# Patient Record
Sex: Female | Born: 1940 | Race: White | Hispanic: No | Marital: Married | Smoking: Never smoker
Health system: Southern US, Community
[De-identification: ages and names within clinical notes are randomized; demographics above are authoritative.]

## PROBLEM LIST (undated history)

## (undated) DIAGNOSIS — E785 Hyperlipidemia, unspecified: Secondary | ICD-10-CM

## (undated) DIAGNOSIS — M722 Plantar fascial fibromatosis: Secondary | ICD-10-CM

## (undated) DIAGNOSIS — D649 Anemia, unspecified: Secondary | ICD-10-CM

## (undated) DIAGNOSIS — M1990 Unspecified osteoarthritis, unspecified site: Secondary | ICD-10-CM

## (undated) DIAGNOSIS — E039 Hypothyroidism, unspecified: Secondary | ICD-10-CM

## (undated) DIAGNOSIS — I10 Essential (primary) hypertension: Secondary | ICD-10-CM

## (undated) DIAGNOSIS — Z780 Asymptomatic menopausal state: Secondary | ICD-10-CM

## (undated) DIAGNOSIS — Z01419 Encounter for gynecological examination (general) (routine) without abnormal findings: Secondary | ICD-10-CM

## (undated) DIAGNOSIS — M8580 Other specified disorders of bone density and structure, unspecified site: Secondary | ICD-10-CM

## (undated) DIAGNOSIS — R350 Frequency of micturition: Secondary | ICD-10-CM

## (undated) DIAGNOSIS — G4700 Insomnia, unspecified: Secondary | ICD-10-CM

## (undated) DIAGNOSIS — R140 Abdominal distension (gaseous): Secondary | ICD-10-CM

## (undated) HISTORY — DX: Unspecified osteoarthritis, unspecified site: M19.90

## (undated) HISTORY — DX: Anemia, unspecified: D64.9

## (undated) HISTORY — DX: Hypothyroidism, unspecified: E03.9

## (undated) HISTORY — DX: Other specified disorders of bone density and structure, unspecified site: M85.80

## (undated) HISTORY — PX: HERNIA REPAIR: SHX51

## (undated) HISTORY — DX: Asymptomatic menopausal state: Z78.0

## (undated) HISTORY — DX: Encounter for gynecological examination (general) (routine) without abnormal findings: Z01.419

## (undated) HISTORY — DX: Hyperlipidemia, unspecified: E78.5

## (undated) HISTORY — DX: Insomnia, unspecified: G47.00

## (undated) HISTORY — DX: Plantar fascial fibromatosis: M72.2

## (undated) HISTORY — DX: Frequency of micturition: R35.0

## (undated) HISTORY — DX: Abdominal distension (gaseous): R14.0

## (undated) HISTORY — DX: Essential (primary) hypertension: I10

## (undated) HISTORY — PX: APPENDECTOMY: SHX54

---

## 1999-03-09 ENCOUNTER — Other Ambulatory Visit: Admission: RE | Admit: 1999-03-09 | Discharge: 1999-03-09 | Payer: Self-pay | Admitting: *Deleted

## 2000-01-20 ENCOUNTER — Other Ambulatory Visit: Admission: RE | Admit: 2000-01-20 | Discharge: 2000-01-20 | Payer: Self-pay | Admitting: Internal Medicine

## 2001-06-05 ENCOUNTER — Other Ambulatory Visit: Admission: RE | Admit: 2001-06-05 | Discharge: 2001-06-05 | Payer: Self-pay | Admitting: Obstetrics and Gynecology

## 2001-07-26 ENCOUNTER — Encounter (INDEPENDENT_AMBULATORY_CARE_PROVIDER_SITE_OTHER): Payer: Self-pay | Admitting: *Deleted

## 2002-08-06 ENCOUNTER — Other Ambulatory Visit: Admission: RE | Admit: 2002-08-06 | Discharge: 2002-08-06 | Payer: Self-pay | Admitting: Obstetrics and Gynecology

## 2003-11-06 ENCOUNTER — Encounter: Admission: RE | Admit: 2003-11-06 | Discharge: 2003-11-06 | Payer: Self-pay | Admitting: Obstetrics and Gynecology

## 2004-05-12 ENCOUNTER — Ambulatory Visit: Payer: Self-pay | Admitting: Family Medicine

## 2004-05-13 ENCOUNTER — Ambulatory Visit: Payer: Self-pay | Admitting: Family Medicine

## 2005-02-09 ENCOUNTER — Encounter: Admission: RE | Admit: 2005-02-09 | Discharge: 2005-02-09 | Payer: Self-pay | Admitting: Obstetrics and Gynecology

## 2005-02-10 ENCOUNTER — Ambulatory Visit: Payer: Self-pay | Admitting: Family Medicine

## 2005-12-06 ENCOUNTER — Ambulatory Visit: Payer: Self-pay | Admitting: Family Medicine

## 2006-05-18 ENCOUNTER — Encounter: Admission: RE | Admit: 2006-05-18 | Discharge: 2006-05-18 | Payer: Self-pay | Admitting: Family Medicine

## 2006-06-02 ENCOUNTER — Encounter: Admission: RE | Admit: 2006-06-02 | Discharge: 2006-06-02 | Payer: Self-pay | Admitting: Family Medicine

## 2006-06-06 ENCOUNTER — Encounter: Admission: RE | Admit: 2006-06-06 | Discharge: 2006-06-06 | Payer: Self-pay | Admitting: Family Medicine

## 2006-08-02 ENCOUNTER — Ambulatory Visit: Payer: Self-pay | Admitting: Family Medicine

## 2006-08-02 LAB — CONVERTED CEMR LAB
Albumin: 3.7 g/dL (ref 3.5–5.2)
Alkaline Phosphatase: 41 units/L (ref 39–117)
Basophils Absolute: 0.1 10*3/uL (ref 0.0–0.1)
Basophils Relative: 1.4 % — ABNORMAL HIGH (ref 0.0–1.0)
Bilirubin, Direct: 0.1 mg/dL (ref 0.0–0.3)
CO2: 28 meq/L (ref 19–32)
Calcium: 9.1 mg/dL (ref 8.4–10.5)
Chloride: 107 meq/L (ref 96–112)
Creatinine, Ser: 0.7 mg/dL (ref 0.4–1.2)
Eosinophils Relative: 4.7 % (ref 0.0–5.0)
GFR calc Af Amer: 108 mL/min
Lymphocytes Relative: 36.2 % (ref 12.0–46.0)
MCHC: 34.2 g/dL (ref 30.0–36.0)
MCV: 88.3 fL (ref 78.0–100.0)
Monocytes Relative: 9.3 % (ref 3.0–11.0)
Platelets: 234 10*3/uL (ref 150–400)
RBC: 4.69 M/uL (ref 3.87–5.11)
RDW: 12.4 % (ref 11.5–14.6)
Sodium: 141 meq/L (ref 135–145)
TSH: 3.01 microintl units/mL (ref 0.35–5.50)
Total Bilirubin: 0.8 mg/dL (ref 0.3–1.2)
Total CHOL/HDL Ratio: 2.8
VLDL: 24 mg/dL (ref 0–40)

## 2006-08-09 ENCOUNTER — Ambulatory Visit: Payer: Self-pay | Admitting: Gastroenterology

## 2006-08-16 ENCOUNTER — Ambulatory Visit: Payer: Self-pay | Admitting: Family Medicine

## 2006-08-17 ENCOUNTER — Encounter: Payer: Self-pay | Admitting: Family Medicine

## 2006-08-17 ENCOUNTER — Ambulatory Visit: Payer: Self-pay | Admitting: Gastroenterology

## 2006-08-17 HISTORY — PX: COLONOSCOPY: SHX174

## 2006-12-22 ENCOUNTER — Encounter: Payer: Self-pay | Admitting: Family Medicine

## 2007-04-05 HISTORY — PX: BREAST BIOPSY: SHX20

## 2007-04-05 LAB — CONVERTED CEMR LAB: Pap Smear: NORMAL

## 2007-06-06 ENCOUNTER — Encounter: Payer: Self-pay | Admitting: Family Medicine

## 2007-06-06 ENCOUNTER — Encounter (INDEPENDENT_AMBULATORY_CARE_PROVIDER_SITE_OTHER): Payer: Self-pay | Admitting: Diagnostic Radiology

## 2007-06-06 ENCOUNTER — Encounter: Admission: RE | Admit: 2007-06-06 | Discharge: 2007-06-06 | Payer: Self-pay | Admitting: Family Medicine

## 2007-12-05 ENCOUNTER — Ambulatory Visit: Payer: Self-pay | Admitting: Family Medicine

## 2007-12-05 DIAGNOSIS — M899 Disorder of bone, unspecified: Secondary | ICD-10-CM

## 2007-12-05 DIAGNOSIS — D649 Anemia, unspecified: Secondary | ICD-10-CM

## 2007-12-05 DIAGNOSIS — T50995A Adverse effect of other drugs, medicaments and biological substances, initial encounter: Secondary | ICD-10-CM

## 2007-12-05 DIAGNOSIS — E785 Hyperlipidemia, unspecified: Secondary | ICD-10-CM

## 2007-12-05 DIAGNOSIS — G4700 Insomnia, unspecified: Secondary | ICD-10-CM | POA: Insufficient documentation

## 2007-12-05 DIAGNOSIS — M949 Disorder of cartilage, unspecified: Secondary | ICD-10-CM

## 2007-12-05 DIAGNOSIS — E039 Hypothyroidism, unspecified: Secondary | ICD-10-CM

## 2007-12-05 LAB — CONVERTED CEMR LAB
Nitrite: NEGATIVE
Specific Gravity, Urine: 1.02

## 2007-12-07 LAB — CONVERTED CEMR LAB
ALT: 15 units/L (ref 0–35)
Albumin: 3.8 g/dL (ref 3.5–5.2)
Basophils Relative: 0.7 % (ref 0.0–3.0)
Calcium: 8.8 mg/dL (ref 8.4–10.5)
Eosinophils Relative: 4.4 % (ref 0.0–5.0)
GFR calc non Af Amer: 76 mL/min
HCT: 39.2 % (ref 36.0–46.0)
HDL: 65 mg/dL (ref >39.0)
Hemoglobin: 13.7 g/dL (ref 12.0–15.0)
MCV: 88.5 fL (ref 78.0–100.0)
Neutro Abs: 3.5 10*3/uL (ref 1.4–7.7)
Neutrophils Relative %: 60.1 % (ref 43.0–77.0)
Platelets: 202 10*3/uL (ref 150–400)
Potassium: 3.7 meq/L (ref 3.5–5.1)
Sodium: 142 meq/L (ref 135–145)
Total Bilirubin: 0.9 mg/dL (ref 0.3–1.2)
Total CHOL/HDL Ratio: 3.1
Total Protein: 6.7 g/dL (ref 6.0–8.3)
Triglycerides: 85 mg/dL (ref 0–149)
Vit D, 1,25-Dihydroxy: 28 — ABNORMAL LOW (ref 30–89)
WBC: 5.9 10*3/uL (ref 4.5–10.5)

## 2007-12-14 ENCOUNTER — Encounter: Payer: Self-pay | Admitting: Family Medicine

## 2007-12-25 ENCOUNTER — Ambulatory Visit: Payer: Self-pay | Admitting: Family Medicine

## 2007-12-27 ENCOUNTER — Telehealth: Payer: Self-pay | Admitting: Family Medicine

## 2008-03-20 ENCOUNTER — Ambulatory Visit: Payer: Self-pay | Admitting: Family Medicine

## 2008-04-07 LAB — CONVERTED CEMR LAB: Pap Smear: NORMAL

## 2008-06-19 ENCOUNTER — Encounter: Admission: RE | Admit: 2008-06-19 | Discharge: 2008-06-19 | Payer: Self-pay | Admitting: Family Medicine

## 2008-07-02 ENCOUNTER — Encounter: Payer: Self-pay | Admitting: Family Medicine

## 2008-08-05 ENCOUNTER — Telehealth: Payer: Self-pay | Admitting: Family Medicine

## 2009-02-05 ENCOUNTER — Ambulatory Visit: Payer: Self-pay | Admitting: Family Medicine

## 2009-02-05 DIAGNOSIS — I10 Essential (primary) hypertension: Secondary | ICD-10-CM

## 2009-02-05 LAB — CONVERTED CEMR LAB
Bilirubin Urine: NEGATIVE
Nitrite: NEGATIVE
Protein, U semiquant: NEGATIVE
Specific Gravity, Urine: 1.02
Urobilinogen, UA: 0.2
WBC Urine, dipstick: NEGATIVE
pH: 6.5

## 2009-02-06 LAB — CONVERTED CEMR LAB
AST: 20 units/L (ref 0–37)
Alkaline Phosphatase: 39 units/L (ref 39–117)
Basophils Absolute: 0 10*3/uL (ref 0.0–0.1)
Basophils Relative: 0.6 % (ref 0.0–3.0)
Cholesterol: 203 mg/dL — ABNORMAL HIGH (ref 0–200)
Eosinophils Absolute: 0.3 10*3/uL (ref 0.0–0.7)
Eosinophils Relative: 4.2 % (ref 0.0–5.0)
GFR calc non Af Amer: 66.25 mL/min (ref >60)
Hemoglobin: 13.8 g/dL (ref 12.0–15.0)
Lymphocytes Relative: 25.9 % (ref 12.0–46.0)
Lymphs Abs: 1.6 10*3/uL (ref 0.7–4.0)
MCHC: 35.2 g/dL (ref 30.0–36.0)
MCV: 91.8 fL (ref 78.0–100.0)
Monocytes Absolute: 0.6 10*3/uL (ref 0.1–1.0)
Neutro Abs: 3.8 10*3/uL (ref 1.4–7.7)
Platelets: 182 10*3/uL (ref 150.0–400.0)
RBC: 4.27 M/uL (ref 3.87–5.11)
RDW: 12.9 % (ref 11.5–14.6)
VLDL: 20 mg/dL (ref 0.0–40.0)

## 2009-04-07 ENCOUNTER — Telehealth: Payer: Self-pay | Admitting: Family Medicine

## 2009-06-23 ENCOUNTER — Encounter: Admission: RE | Admit: 2009-06-23 | Discharge: 2009-06-23 | Payer: Self-pay | Admitting: Obstetrics and Gynecology

## 2009-10-20 ENCOUNTER — Telehealth: Payer: Self-pay | Admitting: Family Medicine

## 2009-12-17 ENCOUNTER — Ambulatory Visit: Payer: Self-pay | Admitting: Internal Medicine

## 2009-12-19 LAB — CONVERTED CEMR LAB
ALT: 11 units/L (ref 0–35)
AST: 17 units/L (ref 0–37)
Albumin: 3.2 g/dL — ABNORMAL LOW (ref 3.5–5.2)
Alkaline Phosphatase: 60 units/L (ref 39–117)
Basophils Relative: 0.2 % (ref 0.0–3.0)
Creatinine, Ser: 0.8 mg/dL (ref 0.4–1.2)
HCT: 35.6 % — ABNORMAL LOW (ref 36.0–46.0)
MCHC: 34 g/dL (ref 30.0–36.0)
MCV: 91.3 fL (ref 78.0–100.0)
RBC: 3.9 M/uL (ref 3.87–5.11)
RDW: 14.9 % — ABNORMAL HIGH (ref 11.5–14.6)
Sodium: 137 meq/L (ref 135–145)
Total Bilirubin: 0.5 mg/dL (ref 0.3–1.2)
Total Protein: 6.3 g/dL (ref 6.0–8.3)
WBC: 13.6 10*3/uL — ABNORMAL HIGH (ref 4.5–10.5)

## 2010-04-21 ENCOUNTER — Ambulatory Visit
Admission: RE | Admit: 2010-04-21 | Discharge: 2010-04-21 | Payer: Self-pay | Source: Home / Self Care | Attending: Family Medicine | Admitting: Family Medicine

## 2010-04-21 ENCOUNTER — Encounter: Payer: Self-pay | Admitting: Family Medicine

## 2010-04-21 ENCOUNTER — Telehealth: Payer: Self-pay | Admitting: Family Medicine

## 2010-04-21 ENCOUNTER — Other Ambulatory Visit: Payer: Self-pay | Admitting: Family Medicine

## 2010-04-21 LAB — HEPATIC FUNCTION PANEL
ALT: 13 U/L (ref 0–35)
AST: 17 U/L (ref 0–37)
Albumin: 3.7 g/dL (ref 3.5–5.2)
Alkaline Phosphatase: 46 U/L (ref 39–117)
Bilirubin, Direct: 0.1 mg/dL (ref 0.0–0.3)
Total Bilirubin: 0.5 mg/dL (ref 0.3–1.2)
Total Protein: 6.4 g/dL (ref 6.0–8.3)

## 2010-04-21 LAB — CONVERTED CEMR LAB
Nitrite: NEGATIVE
Protein, U semiquant: NEGATIVE
Specific Gravity, Urine: 1.015
Urobilinogen, UA: 0.2
WBC Urine, dipstick: NEGATIVE
pH: 6.5

## 2010-04-21 LAB — CBC WITH DIFFERENTIAL/PLATELET
Basophils Absolute: 0 10*3/uL (ref 0.0–0.1)
Basophils Relative: 0.6 % (ref 0.0–3.0)
Eosinophils Absolute: 0.3 10*3/uL (ref 0.0–0.7)
Eosinophils Relative: 5.5 % — ABNORMAL HIGH (ref 0.0–5.0)
HCT: 38.6 % (ref 36.0–46.0)
Hemoglobin: 13.3 g/dL (ref 12.0–15.0)
Lymphocytes Relative: 25.9 % (ref 12.0–46.0)
Lymphs Abs: 1.5 10*3/uL (ref 0.7–4.0)
MCHC: 34.4 g/dL (ref 30.0–36.0)
MCV: 88.1 fl (ref 78.0–100.0)
Monocytes Absolute: 0.6 10*3/uL (ref 0.1–1.0)
Monocytes Relative: 10.3 % (ref 3.0–12.0)
Neutro Abs: 3.4 10*3/uL (ref 1.4–7.7)
Neutrophils Relative %: 57.7 % (ref 43.0–77.0)
Platelets: 237 10*3/uL (ref 150.0–400.0)
RBC: 4.38 Mil/uL (ref 3.87–5.11)
RDW: 15.6 % — ABNORMAL HIGH (ref 11.5–14.6)
WBC: 5.9 10*3/uL (ref 4.5–10.5)

## 2010-04-21 LAB — BASIC METABOLIC PANEL
BUN: 16 mg/dL (ref 6–23)
CO2: 27 mEq/L (ref 19–32)
Calcium: 9.3 mg/dL (ref 8.4–10.5)
Chloride: 108 mEq/L (ref 96–112)
Creatinine, Ser: 0.7 mg/dL (ref 0.4–1.2)
GFR: 89.7 mL/min (ref >60.00)
Glucose, Bld: 88 mg/dL (ref 70–99)
Potassium: 5.2 mEq/L — ABNORMAL HIGH (ref 3.5–5.1)
Sodium: 141 mEq/L (ref 135–145)

## 2010-04-21 LAB — LIPID PANEL
Cholesterol: 207 mg/dL — ABNORMAL HIGH (ref 0–200)
HDL: 81.2 mg/dL (ref >39.00)
Total CHOL/HDL Ratio: 3
Triglycerides: 116 mg/dL (ref 0.0–149.0)
VLDL: 23.2 mg/dL (ref 0.0–40.0)

## 2010-04-21 LAB — TSH: TSH: 1.86 u[IU]/mL (ref 0.35–5.50)

## 2010-04-21 LAB — FOLLICLE STIMULATING HORMONE: FSH: 32.7 m[IU]/mL

## 2010-04-21 LAB — LDL CHOLESTEROL, DIRECT: Direct LDL: 110.7 mg/dL

## 2010-04-25 ENCOUNTER — Encounter: Payer: Self-pay | Admitting: Family Medicine

## 2010-05-06 NOTE — Procedures (Signed)
Summary: COLON (Dr Blossom Hoops)   Colonoscopy  Procedure date:  07/26/2001  Findings:      Location:  [facility].    Procedures Next Due Date:    Colonoscopy: 08/2006 Patient Name: Katherine Holland, Katherine Holland MRN:  Procedure Procedures: Colonoscopy CPT: 13086.    with biopsy. CPT: Q5068410.  Colorectal cancer screening, average risk CPT: G0121.  Personnel: Endoscopist: Ulyess Mort, MD.  Referred By: Rogene Houston, MD.  Exam Location: Exam performed in Outpatient Clinic. Outpatient  Patient Consent: Procedure, Alternatives, Risks and Benefits discussed, consent obtained, from patient. Consent was obtained by the RN.  Indications Symptoms: Abdominal pain / bloating.  Average Risk Screening Routine.  History  Pre-Exam Physical: Performed Jul 26, 2001. Cardio-pulmonary exam, Rectal exam, Abdominal exam, Extremity exam, Mental status exam WNL.  Exam Exam: Extent of exam reached: Cecum, extent intended: Cecum.  The cecum was identified by appendiceal orifice and IC valve. Colon retroflexion performed. Images were not taken. ASA Classification: II. Tolerance: fair, adequate exam.  Monitoring: Pulse and BP monitoring, Oximetry used. Supplemental O2 given.  Colon Prep Prep results: good.  Sedation Meds: Patient assessed and found to be appropriate for moderate (conscious) sedation. Fentanyl 125 mcg. given IV. Versed 12 given IV.  Findings - DIVERTICULOSIS: Descending Colon to Sigmoid Colon. Comments: mild diverticulosis.  - NOT SEEN ON EXAM: Cecum to Rectum. Polyps, AVM's, Tumors, Melanosis, Crohn's, Hemorrhoids, Comments: this is a very tortuous and redundant colon. slight erythema of mucosa R side bx,s =3  ??? mild underlying colitis.   Assessment Abnormal examination, see findings above.  Events  Unplanned Interventions: No intervention was required.  Unplanned Events: There were no complications. Plans Medication Plan: Continue current  medications.  Patient Education: Patient given standard instructions for: Diverticulosis. Yearly hemoccult testing recommended. Patient instructed to get routine colonoscopy every 5 years.  Disposition: After procedure patient sent to recovery. After recovery patient sent home.    This report was created from the original endoscopy report, which was reviewed and signed by the above listed endoscopist.

## 2010-05-06 NOTE — Progress Notes (Signed)
Summary: refiils  Phone Note Refill Request Message from:  Fax from Pharmacy on April 21, 2010 4:44 PM  Refills Requested: Medication #1:  PROMETRIUM 100 MG CAPS once daily  Medication #2:  RAMIPRIL 2.5 MG CAPS 1 by mouth once daily Initial call taken by: Kern Reap CMA Duncan Dull),  April 21, 2010 4:44 PM    Prescriptions: RAMIPRIL 2.5 MG CAPS (RAMIPRIL) 1 by mouth once daily  #30 x 11   Entered by:   Kern Reap CMA (AAMA)   Authorized by:   Judithann Sheen MD   Signed by:   Kern Reap CMA (AAMA) on 04/21/2010   Method used:   Electronically to        [facility] (retail)       [address]       [place], [place]  [address]       Ph: [phone] or [phone]       Fax: [phone]   RxID:   [id number] PROMETRIUM 100 MG CAPS (PROGESTERONE MICRONIZED) once daily  #30 x 11   Entered by:   Kern Reap CMA (AAMA)   Authorized by:   Judithann Sheen MD   Signed by:   Kern Reap CMA (AAMA) on 04/21/2010   Method used:   Electronically to        [facility] (retail)       [address]       [place], [place]  [address]       Ph: [phone] or [phone]       Fax: [phone]   RxID:   [id number]

## 2010-05-06 NOTE — Progress Notes (Signed)
Summary: refill ambien with 5 refills   Phone Note From Pharmacy   Caller: [facility]  Reason for Call: Needs renewal Summary of Call: refill gen Remus Loffler  Initial call taken by: Pura Spice, RN,  April 07, 2009 10:46 AM  Follow-up for Phone Call        ok per dr Scotty Court with 5 refills  Follow-up by: Pura Spice, RN,  April 07, 2009 10:46 AM    Prescriptions: AMBIEN 10 MG TABS (ZOLPIDEM TARTRATE) 1 HS FOR SLEEP  #30 x 5   Entered by:   Pura Spice, RN   Authorized by:   Judithann Sheen MD   Signed by:   Pura Spice, RN on 04/07/2009   Method used:   Telephoned to ...       [facility] (retail)       [address]       [place], [place]  [address]       Ph: [phone] or [phone]       Fax: [phone]   RxID:   [id number]

## 2010-05-06 NOTE — Assessment & Plan Note (Signed)
Summary: pt will come in fasting/njr   Vital Signs:  Patient profile:   70 year old female Height:      63.25 inches Weight:      143 pounds Pulse rate:   80 / minute Pulse rhythm:   regular BP sitting:   120 / 82  (left arm)  Vitals Entered By: Kyung Rudd, CMA (April 21, 2010 11:01 AM)  History of Present Illness: This 70 year old white married female is in for discussion of her medical problems as listed per year it she has her Pap smear was Dr. Floyde Parkins, gynecologist Patient relates he feels good with no major complaint. She continues to need Ambien for chronic insomnia. She as nasal allergies which is [place] a somewhat with Zyrtec 10 mg q. day Hypertension has been well controlled with Remeron for a triple 5 mg q.d. for many years Patient has had flu injection as well as up-to-date on pneumonia  Current Medications (verified): 1)  Ramipril 2.5 Mg Caps (Ramipril) .Marland Kitchen.. 1 By Mouth Once Daily 2)  Prometrium 100 Mg Caps (Progesterone Micronized) .... Once Daily 3)  Ambien 10 Mg Tabs (Zolpidem Tartrate) .Marland Kitchen.. 1 Hs For Sleep 4)  Adult Aspirin Ec Low Strength 81 Mg Tbec (Aspirin) .Marland Kitchen.. 1 Qd 5)  Premarin 0.45 Mg Tabs (Estrogens Conjugated) .... One Tablet By Mouth Once Daily 6)  Calcium 500 Mg Tabs (Calcium) .... One Tablet By Mouth Once Daily 7)  Vitamin E 600 Unit  Caps (Vitamin E) .... 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  Alcohol Use - yes: 1 daily  Daily Caffeine Use: 1 daily  Illicit Drug Use - no  Risk Factors: Smoking Status: never (12/17/2009)  Social History: Reviewed history from 12/17/2009 and no changes required. Retired Married Childern Patient has never smoked.  Alcohol Use - yes: 1 daily  Daily Caffeine Use: 1 daily  Illicit Drug Use - no  Review of Systems      See HPI General:  See HPI. Eyes:  Denies blurring, discharge, double vision, eye irritation, eye pain, halos, itching, light sensitivity, red eye, vision loss-1 eye, and vision loss-both eyes. ENT:  Complains of nasal congestion. CV:  Denies bluish discoloration of lips or nails, chest pain or discomfort, difficulty breathing at night, difficulty breathing while lying down, fainting, fatigue, leg cramps with exertion, lightheadness, near fainting, palpitations, shortness of breath with exertion, swelling of feet, swelling of hands, and weight gain; blood pressure well controlled. Resp:  Denies chest discomfort, chest pain with inspiration, cough, coughing up blood, excessive snoring, hypersomnolence, morning headaches, pleuritic, shortness of breath, sputum productive, and wheezing. GI:  Denies abdominal pain, bloody stools, change in bowel habits, constipation, dark tarry stools, diarrhea, excessive appetite, gas, hemorrhoids, indigestion, loss of appetite, nausea, vomiting, vomiting blood, and yellowish skin color. GU:  Denies abnormal vaginal bleeding, decreased libido, discharge, dysuria, genital sores, hematuria, incontinence, nocturia, urinary frequency, and urinary hesitancy. MS:  Denies joint pain, joint redness, joint swelling, loss of strength, low back pain, mid back pain,  muscle aches, muscle , cramps, muscle weakness, stiffness, and  thoracic pain. Derm:  Denies changes in color of skin, changes in nail beds, dryness, excessive perspiration, flushing, hair loss, insect bite(s), itching, lesion(s), poor wound healing, and rash. Neuro:  Denies brief paralysis, difficulty with concentration, disturbances in coordination, falling down, headaches, inability to speak, memory loss, numbness, poor balance, seizures, sensation of room spinning, tingling, tremors, visual disturbances, and weakness. Psych:  Denies alternate hallucination ( auditory/visual), anxiety, depression, easily angered, easily tearful, irritability, mental problems, panic attacks, sense of great danger, suicidal thoughts/plans, thoughts of violence, unusual visions or sounds, and thoughts /plans of harming others.  Physical Exam  General:  Well-developed,well-nourished,in no acute distress; alert,appropriate and cooperative throughout examination Head:  Normocephalic and atraumatic without obvious abnormalities. No apparent alopecia or balding. Eyes:  No corneal or conjunctival inflammation noted. EOMI. Perrla. Funduscopic exam benign, without hemorrhages, exudates or papilledema. Vision grossly normal. Ears:  External ear exam shows no significant lesions or deformities.  Otoscopic examination reveals clear canals, tympanic membranes are intact bilaterally without bulging, retraction, inflammation or discharge. Hearing is grossly normal bilaterally. Nose:  nasal mucosa slightly swollen pale minimal bogginess clear drainage Mouth:  Oral mucosa and oropharynx without lesions or exudates.  Teeth in good repair. Neck:  No deformities, masses, or tenderness noted. Chest Wall:  No deformities, masses, or tenderness noted. Breasts:  examined by Dr. Rosalio Macadamia Lungs:  Normal respiratory effort, chest expands symmetrically. Lungs are clear to auscultation, no crackles or wheezes. Heart:  Normal rate and regular rhythm. S1 and S2 normal without gallop, murmur, click, rub or  other extra sounds. Abdomen:  Bowel sounds positive,abdomen soft and non-tender without masses, organomegaly or hernias noted. Rectal:  GYN Msk:  No deformity or scoliosis noted of thoracic or lumbar spine.   Pulses:  R and L carotid,radial,femoral,dorsalis pedis and posterior tibial pulses are full and equal bilaterally Extremities:  No clubbing, cyanosis, edema, or deformity noted with normal full range of motion of all joints.   Neurologic:  No cranial nerve deficits noted. Station and gait are normal. Plantar reflexes are down-going bilaterally. DTRs are symmetrical throughout. Sensory, motor and coordinative functions appear intact.   Impression & Recommendations:  Problem # 1:  ESSENTIAL HYPERTENSION, BENIGN (ICD-401.1) Assessment Improved  Her updated medication list for this problem includes:    Ramipril 2.5 Mg Caps (Ramipril) .Marland Kitchen... 1 by mouth once daily  Orders: Specimen Handling ([address]) TLB-BMP (Basic Metabolic Panel-BMET) (80048-METABOL)  Problem # 2:  INSOMNIA, CHRONIC (ICD-307.42) Assessment: Unchanged Ambien 10 mg h.s.  Problem # 3:  POSTMENOPAUSAL ON HORMONE REPLACEMENT THERAPY (ICD-V07.4)  Orders: Specimen Handling (40981) TLB-CBC Platelet - w/Differential (85025-CBCD) TLB-FSH (Follicle Stimulating Hormone) (83001-FSH)  Problem # 4:  OSTEOPENIA (ICD-733.90) Assessment: Unchanged  The following medications were removed from the medication list:    Calcium 500 Mg Tabs (Calcium) ..... One tablet by mouth once daily  Complete Medication List: 1)  Ramipril 2.5 Mg Caps (Ramipril) .Marland Kitchen.. 1 by mouth once daily 2)  Prometrium 100 Mg Caps (Progesterone micronized) .... Once daily 3)  Ambien 10 Mg Tabs (Zolpidem tartrate) .Marland Kitchen.. 1 hs for sleep 4)  Adult Aspirin Ec Low Strength 81 Mg Tbec (Aspirin) .Marland Kitchen.. 1 qd 5)  Vitamin E 600 Unit Caps (Vitamin e) .... One capsule by mouth once daily 6)  Os Cal Vit D  7)  Premarin 0.3 Mg Tabs (Estrogens conjugated) .Marland Kitchen.. 1 qd  Other  Orders: Venipuncture (19147) T-Vitamin D (25-Hydroxy) ([address]2956-21308) UA Dipstick w/o Micro (automated)  (  81003) TLB-Lipid Panel (80061-LIPID) TLB-Hepatic/Liver Function Pnl (80076-HEPATIC) TLB-TSH (Thyroid Stimulating Hormone) (84443-TSH)  Patient Instructions: 1)  continue to exercise as you're doing 2)  Continue her regular medications Prescriptions: AMBIEN 10 MG TABS (ZOLPIDEM TARTRATE) 1 HS FOR SLEEP  #30 x 11   Entered and Authorized by:   Judithann Sheen MD   Signed by:   Judithann Sheen MD on 04/21/2010   Method used:   Print then Give to Patient   RxID:   [id number] PROMETRIUM 100 MG CAPS (PROGESTERONE MICRONIZED) once daily  #30 x 11   Entered and Authorized by:   Judithann Sheen MD   Signed by:   Judithann Sheen MD on 04/21/2010   Method used:   Electronically to        [facility] ([facility]ail)       [address]       [place], [place]  [address]       Ph: [phone] or [phone]       Fax: [phone]   RxID:   [id number] RAMIPRIL 2.5 MG CAPS (RAMIPRIL) 1 by mouth once daily  #30 x 11   Entered and Authorized by:   Judithann Sheen MD   Signed by:   Judithann Sheen MD on 04/21/2010   Method used:   Electronically to        [facility] (retail)       [address]       [place], [place]  [address]       Ph: [phone] or [phone]       Fax: [phone]   RxID:   [id number] PREMARIN 0.3 MG TABS (ESTROGENS CONJUGATED) 1 qd  #30 x 11   Entered and Authorized by:   Judithann Sheen MD   Signed by:   Judithann Sheen MD on 04/21/2010   Method used:   Electronically to        [facility] (retail)       [address]       [place], [place]  [address]       Ph: [phone] or [phone]       Fax: [phone]   RxID:   [id number] AMBIEN 10 MG TABS (ZOLPIDEM TARTRATE) 1 HS FOR SLEEP  #30 x 5   Entered and Authorized by:   Judithann Sheen MD   Signed by:   Judithann Sheen MD on  04/21/2010   Method used:   Print then Give to Patient   RxID:   [id number] PROMETRIUM 100 MG CAPS (PROGESTERONE MICRONIZED) once daily  #30 x 11   Entered and Authorized by:   Judithann Sheen MD   Signed by:   Judithann Sheen MD on 04/21/2010   Method used:   Electronically to        [facility]* (retail)       [address]       [place], [place]  [address]       Ph: [phone]       Fax: [phone]   RxID:   [id number] RAMIPRIL 2.5 MG CAPS (RAMIPRIL) 1 by mouth once daily  #30 x 11   Entered and Authorized by:   Judithann Sheen MD   Signed by:   Judithann Sheen MD on 04/21/2010   Method used:   Electronically to  [facility]* (retail)       [address]       [place], [place]  [address]       Ph: [phone]       Fax: [phone]   RxID:   [id number]    Orders Added: 1)  Venipuncture [84132] 2)  T-Vitamin D (25-Hydroxy) [phone] 3)  UA Dipstick w/o Micro (automated)  [81003] 4)  Specimen Handling [99000] 5)  TLB-Lipid Panel [80061-LIPID] 6)  TLB-BMP (Basic Metabolic Panel-BMET) [80048-METABOL] 7)  TLB-CBC Platelet - w/Differential [85025-CBCD] 8)  TLB-Hepatic/Liver Function Pnl [80076-HEPATIC] 9)  TLB-TSH (Thyroid Stimulating Hormone) [84443-TSH] 10)  TLB-FSH (Follicle Stimulating Hormone) [83001-FSH] 11)  Est. Patient Level IV [66440]    Laboratory Results   Urine Tests  Date/Time Recieved: April 21, 2010 3:03 PM  Date/Time Reported: April 21, 2010 3:03 PM   Routine Urinalysis   Color: yellow Appearance: Clear Glucose: negative   (Normal Range: Negative) Bilirubin: negative   (Normal Range: Negative) Ketone: negative   (Normal Range: Negative) Spec. Gravity: 1.015   (Normal Range: 1.003-1.035) Blood: negative   (Normal Range: Negative) pH: 6.5   (Normal Range: 5.0-8.0) Protein: negative   (Normal Range: Negative) Urobilinogen: 0.2   (Normal Range: 0-1) Nitrite: negative    (Normal Range: Negative) Leukocyte Esterace: negative   (Normal Range: Negative)    Comments: Wynona Canes, CMA  April 21, 2010 3:03 PM

## 2010-05-06 NOTE — Progress Notes (Signed)
Summary: rx zolpidem 10 mg w/ 5 refills   Phone Note From Pharmacy   [facility]  Reason for Call: Needs renewal Summary of Call: refill zolpidem 10 mg  Initial call taken by: Pura Spice, RN,  October 20, 2009 3:16 PM  Follow-up for Phone Call        ok per dr Scotty Court x 5  Follow-up by: Pura Spice, RN,  October 20, 2009 3:17 PM    New/Updated Medications: AMBIEN 10 MG TABS (ZOLPIDEM TARTRATE) 1 HS FOR SLEEP Prescriptions: AMBIEN 10 MG TABS (ZOLPIDEM TARTRATE) 1 HS FOR SLEEP  #30 x 5   Entered by:   Pura Spice, RN   Authorized by:   Judithann Sheen MD   Signed by:   Pura Spice, RN on 10/20/2009   Method used:   Printed then faxed to ...       [facility] ([facility]ail)       [address]       [place], [place]  [address]       Ph: [phone] or [phone]       Fax: [phone]   RxID:   [id number]

## 2010-05-06 NOTE — Procedures (Signed)
Summary: COLON  Colonoscopy Report/[facility]   Imported By: Maryln Gottron 09/16/2009 10:48:59  _____________________________________________________________________  External Attachment:    Type:   Image     Comment:   External Document

## 2010-05-06 NOTE — Assessment & Plan Note (Signed)
Summary: change in bowels, constipation/dn    History of Present Illness Visit Type: consult  Primary GI MD: Lina Sar MD Primary Provider: Valarie Merino, MD  Requesting Provider: Valarie Merino, MD Chief Complaint: Lower abd pain, constipation, and bloating  History of Present Illness:   This is a 70 y.o. white female with a rather abrupt onset of constipation. Until 3 weeks ago, she was able to have a bowel movement every day. She never used laxatives. She had a normal colonoscopy except for diverticulosis in May 2008. She had a small rectocele and a few scattered diverticula. She also had a very tortuous colon with fixed segments secondary to adhesions according to Dr Jackey Loge' report. She denies any rectal bleeding. She had a regular gynecological examination by Dr Rosalio Macadamia. The patient's husband had recent surgery and patient has been attending to him and consequently her eating habits have been irregular. Also, her level of activity has changed.   GI Review of Systems    Reports abdominal pain and  bloating.     Location of  Abdominal pain: lower abdomen.    Denies acid reflux, belching, chest pain, dysphagia with liquids, dysphagia with solids, heartburn, loss of appetite, nausea, vomiting, vomiting blood, weight loss, and  weight gain.      Reports constipation.     Denies anal fissure, black tarry stools, change in bowel habit, diarrhea, diverticulosis, fecal incontinence, heme positive stool, hemorrhoids, irritable bowel syndrome, jaundice, light color stool, liver problems, rectal bleeding, and  rectal pain.    Current Medications (verified): 1)  Altace 2.5 Mg Tabs (Ramipril) .... Once Daily 2)  Prometrium 100 Mg Caps (Progesterone Micronized) .... Once Daily 3)  Ambien 10 Mg Tabs (Zolpidem Tartrate) .Marland Kitchen.. 1 Hs For Sleep 4)  Adult Aspirin Ec Low Strength 81 Mg Tbec (Aspirin) .Marland Kitchen.. 1 Qd 5)  Premarin 0.45 Mg Tabs (Estrogens Conjugated) .... One Tablet By Mouth Once  Daily 6)  Calcium 500 Mg Tabs (Calcium) .... One Tablet By Mouth Once Daily 7)  Vitamin E 600 Unit  Caps (Vitamin E) .... 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  Alcohol Use - yes: 1 daily  Daily Caffeine Use: 1 daily  Illicit Drug Use - no Smoking Status:  never Drug Use:  no  Review of Systems       The patient complains of allergy/sinus and sleeping problems.  The patient denies anemia, anxiety-new, arthritis/joint pain, back pain, blood in urine, breast changes/lumps, change in vision, confusion, cough, coughing up blood, depression-new, fainting, fatigue, fever, headaches-new, hearing problems, heart murmur, heart rhythm changes, itching, menstrual pain, muscle pains/cramps, night sweats, nosebleeds, pregnancy symptoms, shortness of breath, skin rash, sore throat, swelling of feet/legs, swollen lymph glands, thirst - excessive , urination - excessive , urination changes/pain, urine leakage, vision changes, and voice change.         Pertinent positive and negative review of systems were noted in the above HPI. All other ROS was otherwise negative.   Vital Signs:  Patient profile:  70 year old female Height:      63.5 inches Weight:      145 pounds BMI:     25.37 BSA:     1.70 Pulse rate:    88 / minute Pulse rhythm:   regular BP sitting:   132 / 68  (left arm) Cuff size:   regular  Vitals Entered By: Ok Anis CMA (December 17, 2009 1:14 PM)  Physical Exam  General:  Well developed, well nourished, no acute distress. Eyes:  PERRLA, no icterus. Mouth:  No deformity or lesions, dentition normal. Neck:  Supple; no masses or thyromegaly. Lungs:  Clear throughout to auscultation. Heart:  Regular rate and rhythm; no murmurs, rubs,  or bruits. Abdomen:  protuberant abdomen with hyperactive bowel sounds and rushes. Mild tenderness, diffusely more so across the upper abdomen. Increased tympany. No palpable mass. No ascites. Liver edge at costal margin. Rectal:  normal perianal area with normal sphincter tone. In the rectal ampulla, there is no prolapse, no stool. Tiny amount of stool was Hemoccult negative. Extremities:  No clubbing, cyanosis, edema or deformities noted. Skin:  Intact without significant lesions or rashes. Psych:  Alert and cooperative. Normal mood and affect.   Impression & Recommendations:  Problem # 1:  CONSTIPATION (ICD-564.00) Patient has had sudden onset constipation which may be related to a change in her eating habits and activity in the last few weeks but it is suspicious for an intestinal obstruction. She has numerous laxatives in the last 7 days. We will obtain a KUB to rule out small or large bowel obstruction and then decide if we should go forward with a CT scan of the abdomen. We could push laxatives specifically magnesium citrate and MiraLax. We will obtain a CBC, metabolic panel and TSH today. Her last colonoscopy was in 2008. We may also need to consider pelvic ultrasound versus CT scan of the abdomen. Orders: T-Abdomen 2-view (74020TC) TLB-CMP (Comprehensive Metabolic Pnl) (80053-COMP) TLB-CBC Platelet - w/Differential (85025-CBCD) TLB-TSH (Thyroid Stimulating Hormone) (84443-TSH)  Problem # 2:  HYPOTHYROIDISM (ICD-244.9) We will be  checking a TSH today.  Problem # 3:  CONSTIPATION (ICD-564.00) Orders: T-Abdomen 2-view (74020TC) TLB-CMP (Comprehensive Metabolic Pnl) (80053-COMP) TLB-CBC Platelet - w/Differential (85025-CBCD) TLB-TSH (Thyroid Stimulating Hormone) (84443-TSH)  Patient Instructions: 1)  flat and upright x-ray of the abdomen to rule out obstruction. 2)  Depending on the results, we will schedule a CT scan of the abdomen and pelvis. 3)  CBC, metabolic panel and TSH. 4)  Consider magnesium citrate and MiraLax prep depending on the results of KUB. 5)  Copy sent to : Dr Dellie Burns 6)  The medication list was reviewed and reconciled.  All changed / newly prescribed medications were explained.  A complete medication list was provided to the patient / caregiver.

## 2010-06-23 ENCOUNTER — Other Ambulatory Visit: Payer: Self-pay | Admitting: Family Medicine

## 2010-06-23 DIAGNOSIS — Z1231 Encounter for screening mammogram for malignant neoplasm of breast: Secondary | ICD-10-CM

## 2010-07-12 ENCOUNTER — Ambulatory Visit
Admission: RE | Admit: 2010-07-12 | Discharge: 2010-07-12 | Disposition: A | Discharge status: Home / Self Care | Payer: Medicare Other | Source: Ambulatory Visit | Attending: Family Medicine | Admitting: Family Medicine

## 2010-07-12 DIAGNOSIS — Z1231 Encounter for screening mammogram for malignant neoplasm of breast: Secondary | ICD-10-CM

## 2010-09-24 ENCOUNTER — Other Ambulatory Visit: Payer: Self-pay | Admitting: Family Medicine

## 2010-10-20 ENCOUNTER — Other Ambulatory Visit: Payer: Self-pay

## 2010-10-20 MED ORDER — ZOLPIDEM TARTRATE 10 MG PO TABS: 10.0000 mg | ORAL_TABLET | Freq: Every evening | ORAL | Status: DC | PRN

## 2010-10-20 NOTE — Telephone Encounter (Signed)
rx request for pharmacy for zolpidem tartrate 10 30x5

## 2011-01-26 ENCOUNTER — Ambulatory Visit (INDEPENDENT_AMBULATORY_CARE_PROVIDER_SITE_OTHER): Payer: Medicare Other

## 2011-01-26 DIAGNOSIS — Z23 Encounter for immunization: Secondary | ICD-10-CM

## 2011-04-08 ENCOUNTER — Other Ambulatory Visit: Payer: Self-pay | Admitting: Family Medicine

## 2011-05-25 ENCOUNTER — Other Ambulatory Visit: Payer: Self-pay | Admitting: Family Medicine

## 2011-05-25 ENCOUNTER — Telehealth: Payer: Self-pay | Admitting: Family Medicine

## 2011-05-25 DIAGNOSIS — Z1231 Encounter for screening mammogram for malignant neoplasm of breast: Secondary | ICD-10-CM

## 2011-05-25 NOTE — Telephone Encounter (Signed)
Patient is requesting to be scheduled for her bone density at [facility] on 07/13/11 the same day as her mammogram. Please assist and inform patient.

## 2011-05-26 NOTE — Telephone Encounter (Signed)
Pt called back and lft a vm, stating that she will just wait and talk to Dr Clent Ridges about it when she is sch to come in for ov to see him in April 2013

## 2011-05-26 NOTE — Telephone Encounter (Signed)
Left v/m for pt to cb. 

## 2011-05-26 NOTE — Telephone Encounter (Signed)
I need to see her first. I cannot order a test on someone I have never met

## 2011-06-06 ENCOUNTER — Other Ambulatory Visit: Payer: Self-pay | Admitting: Family Medicine

## 2011-07-12 ENCOUNTER — Encounter: Payer: Self-pay | Admitting: Family Medicine

## 2011-07-13 ENCOUNTER — Ambulatory Visit
Admission: RE | Admit: 2011-07-13 | Discharge: 2011-07-13 | Disposition: A | Discharge status: Home / Self Care | Payer: Medicare Other | Source: Ambulatory Visit | Attending: Family Medicine | Admitting: Family Medicine

## 2011-07-13 ENCOUNTER — Ambulatory Visit (INDEPENDENT_AMBULATORY_CARE_PROVIDER_SITE_OTHER): Payer: Medicare Other | Admitting: Family Medicine

## 2011-07-13 ENCOUNTER — Encounter: Payer: Self-pay | Admitting: Family Medicine

## 2011-07-13 ENCOUNTER — Ambulatory Visit: Payer: Medicare Other

## 2011-07-13 VITALS — BP 138/82 | HR 86 | Temp 98.5°F | Ht 64.5 in | Wt 150.0 lb

## 2011-07-13 DIAGNOSIS — G4700 Insomnia, unspecified: Secondary | ICD-10-CM

## 2011-07-13 DIAGNOSIS — Z1231 Encounter for screening mammogram for malignant neoplasm of breast: Secondary | ICD-10-CM

## 2011-07-13 DIAGNOSIS — Z0000 Encounter for general adult medical examination without abnormal findings: Secondary | ICD-10-CM | POA: Diagnosis not present

## 2011-07-13 DIAGNOSIS — Z13820 Encounter for screening for osteoporosis: Secondary | ICD-10-CM | POA: Diagnosis not present

## 2011-07-13 DIAGNOSIS — E785 Hyperlipidemia, unspecified: Secondary | ICD-10-CM

## 2011-07-13 DIAGNOSIS — I10 Essential (primary) hypertension: Secondary | ICD-10-CM

## 2011-07-13 DIAGNOSIS — Z780 Asymptomatic menopausal state: Secondary | ICD-10-CM | POA: Diagnosis not present

## 2011-07-13 LAB — CBC WITH DIFFERENTIAL/PLATELET
Basophils Relative: 0.9 % (ref 0.0–3.0)
Eosinophils Relative: 5 % (ref 0.0–5.0)
Monocytes Relative: 10.2 % (ref 3.0–12.0)
Neutrophils Relative %: 57.1 % (ref 43.0–77.0)
Platelets: 185 10*3/uL (ref 150.0–400.0)
RBC: 4.42 Mil/uL (ref 3.87–5.11)
WBC: 5.9 10*3/uL (ref 4.5–10.5)

## 2011-07-13 LAB — POCT URINALYSIS DIPSTICK
Bilirubin, UA: NEGATIVE
Ketones, UA: NEGATIVE
pH, UA: 6

## 2011-07-13 LAB — LIPID PANEL
HDL: 77.9 mg/dL (ref >39.00)
Total CHOL/HDL Ratio: 3
VLDL: 15.2 mg/dL (ref 0.0–40.0)

## 2011-07-13 LAB — BASIC METABOLIC PANEL
BUN: 20 mg/dL (ref 6–23)
GFR: 73.24 mL/min (ref >60.00)
Potassium: 4.1 mEq/L (ref 3.5–5.1)

## 2011-07-13 LAB — HEPATIC FUNCTION PANEL
AST: 16 U/L (ref 0–37)
Total Bilirubin: 0.3 mg/dL (ref 0.3–1.2)

## 2011-07-13 LAB — TSH: TSH: 1.57 u[IU]/mL (ref 0.35–5.50)

## 2011-07-13 LAB — LDL CHOLESTEROL, DIRECT: Direct LDL: 120.7 mg/dL

## 2011-07-13 NOTE — Progress Notes (Signed)
  Subjective:    Patient ID: Katherine Holland, female    DOB: 09/21/1940, 71 y.o.   MRN: [id number]  HPI 71 yr old female for a cpx. She feels fine and has no concerns.    Review of Systems  Constitutional: Negative.   HENT: Negative.   Eyes: Negative.   Respiratory: Negative.   Cardiovascular: Negative.   Gastrointestinal: Negative.   Genitourinary: Negative for dysuria, urgency, frequency, hematuria, flank pain, decreased urine volume, enuresis, difficulty urinating, pelvic pain and dyspareunia.  Musculoskeletal: Negative.   Skin: Negative.   Neurological: Negative.   Hematological: Negative.   Psychiatric/Behavioral: Negative.        Objective:   Physical Exam  Constitutional: She is oriented to person, place, and time. She appears well-developed and well-nourished. No distress.  HENT:  Head: Normocephalic and atraumatic.  Right Ear: External ear normal.  Left Ear: External ear normal.  Nose: Nose normal.  Mouth/Throat: Oropharynx is clear and moist. No oropharyngeal exudate.  Eyes: Conjunctivae and EOM are normal. Pupils are equal, round, and reactive to light. No scleral icterus.  Neck: Normal range of motion. Neck supple. No JVD present. No thyromegaly present.  Cardiovascular: Normal rate, regular rhythm, normal heart sounds and intact distal pulses.  Exam reveals no gallop and no friction rub.   No murmur heard.      EKG normal   Pulmonary/Chest: Effort normal and breath sounds normal. No respiratory distress. She has no wheezes. She has no rales. She exhibits no tenderness.  Abdominal: Soft. Bowel sounds are normal. She exhibits no distension and no mass. There is no tenderness. There is no rebound and no guarding.  Musculoskeletal: Normal range of motion. She exhibits no edema and no tenderness.  Lymphadenopathy:    She has no cervical adenopathy.  Neurological: She is alert and oriented to person, place, and time. She has normal reflexes. No cranial nerve deficit.  She exhibits normal muscle tone. Coordination normal.  Skin: Skin is warm and dry. No rash noted. No erythema.  Psychiatric: She has a normal mood and affect. Her behavior is normal. Judgment and thought content normal.          Assessment & Plan:  Well exam. Get fasting labs

## 2011-07-15 NOTE — Progress Notes (Signed)
Quick Note:  Left voice message ______ 

## 2011-07-18 ENCOUNTER — Encounter: Payer: Self-pay | Admitting: Family Medicine

## 2011-07-18 NOTE — Progress Notes (Signed)
Quick Note:  Spoke with pt and put a copy of results in mail. ______ 

## 2011-08-09 ENCOUNTER — Telehealth: Payer: Self-pay | Admitting: Family Medicine

## 2011-08-09 NOTE — Telephone Encounter (Signed)
I left voice message with normal bone density scan results.

## 2011-08-17 ENCOUNTER — Encounter: Payer: Self-pay | Admitting: Family Medicine

## 2011-10-25 ENCOUNTER — Other Ambulatory Visit: Payer: Self-pay | Admitting: Family Medicine

## 2011-11-02 DIAGNOSIS — H52209 Unspecified astigmatism, unspecified eye: Secondary | ICD-10-CM | POA: Diagnosis not present

## 2011-11-02 DIAGNOSIS — H1045 Other chronic allergic conjunctivitis: Secondary | ICD-10-CM | POA: Diagnosis not present

## 2011-11-02 DIAGNOSIS — H04129 Dry eye syndrome of unspecified lacrimal gland: Secondary | ICD-10-CM | POA: Diagnosis not present

## 2011-11-02 DIAGNOSIS — H259 Unspecified age-related cataract: Secondary | ICD-10-CM | POA: Diagnosis not present

## 2011-12-27 ENCOUNTER — Ambulatory Visit (INDEPENDENT_AMBULATORY_CARE_PROVIDER_SITE_OTHER): Payer: Medicare Other

## 2011-12-27 DIAGNOSIS — Z23 Encounter for immunization: Secondary | ICD-10-CM

## 2012-01-18 DIAGNOSIS — L821 Other seborrheic keratosis: Secondary | ICD-10-CM | POA: Diagnosis not present

## 2012-01-18 DIAGNOSIS — D1801 Hemangioma of skin and subcutaneous tissue: Secondary | ICD-10-CM | POA: Diagnosis not present

## 2012-01-18 DIAGNOSIS — L570 Actinic keratosis: Secondary | ICD-10-CM | POA: Diagnosis not present

## 2012-01-18 DIAGNOSIS — L578 Other skin changes due to chronic exposure to nonionizing radiation: Secondary | ICD-10-CM | POA: Diagnosis not present

## 2012-04-09 ENCOUNTER — Telehealth: Payer: Self-pay | Admitting: Family Medicine

## 2012-04-09 NOTE — Telephone Encounter (Signed)
Pt left voice message, she needs scripts to be written out due to pharmacy change. Call when ready.

## 2012-04-11 MED ORDER — RAMIPRIL 2.5 MG PO CAPS: 2.5000 mg | ORAL_CAPSULE | Freq: Every day | ORAL | Status: DC

## 2012-04-11 MED ORDER — ZOLPIDEM TARTRATE 10 MG PO TABS: 10.0000 mg | ORAL_TABLET | Freq: Every evening | ORAL | Status: DC | PRN

## 2012-04-11 MED ORDER — PROGESTERONE MICRONIZED 100 MG PO CAPS: 100.0000 mg | ORAL_CAPSULE | Freq: Every day | ORAL | Status: DC

## 2012-04-11 MED ORDER — ESTROGENS CONJUGATED 0.3 MG PO TABS: 0.3000 mg | ORAL_TABLET | Freq: Every day | ORAL | Status: DC

## 2012-04-11 NOTE — Telephone Encounter (Signed)
I spoke with pt and she just needs scripts sent to [facility] for a 90 day supply. ( total of 4 scripts )

## 2012-04-11 NOTE — Telephone Encounter (Signed)
I called in Ambien and sent the rest of scripts e-scribe.

## 2012-05-08 ENCOUNTER — Other Ambulatory Visit: Payer: Self-pay | Admitting: Family Medicine

## 2012-05-08 DIAGNOSIS — Z1231 Encounter for screening mammogram for malignant neoplasm of breast: Secondary | ICD-10-CM

## 2012-05-12 ENCOUNTER — Other Ambulatory Visit: Payer: Self-pay | Admitting: Family Medicine

## 2012-05-14 DIAGNOSIS — Z124 Encounter for screening for malignant neoplasm of cervix: Secondary | ICD-10-CM | POA: Diagnosis not present

## 2012-05-14 DIAGNOSIS — Z01419 Encounter for gynecological examination (general) (routine) without abnormal findings: Secondary | ICD-10-CM | POA: Diagnosis not present

## 2012-06-28 DIAGNOSIS — L570 Actinic keratosis: Secondary | ICD-10-CM | POA: Diagnosis not present

## 2012-06-29 ENCOUNTER — Other Ambulatory Visit: Payer: Self-pay | Admitting: Family Medicine

## 2012-07-02 NOTE — Telephone Encounter (Signed)
Okay for 30 days only. She will need an OV soon

## 2012-08-01 ENCOUNTER — Ambulatory Visit
Admission: RE | Admit: 2012-08-01 | Discharge: 2012-08-01 | Disposition: A | Discharge status: Home / Self Care | Payer: Medicare Other | Source: Ambulatory Visit | Attending: Family Medicine | Admitting: Family Medicine

## 2012-08-01 DIAGNOSIS — Z1231 Encounter for screening mammogram for malignant neoplasm of breast: Secondary | ICD-10-CM | POA: Diagnosis not present

## 2012-08-07 ENCOUNTER — Encounter: Payer: Medicare Other | Admitting: Family Medicine

## 2012-08-07 ENCOUNTER — Ambulatory Visit (INDEPENDENT_AMBULATORY_CARE_PROVIDER_SITE_OTHER): Payer: Medicare Other | Admitting: Family Medicine

## 2012-08-07 ENCOUNTER — Encounter: Payer: Self-pay | Admitting: Family Medicine

## 2012-08-07 VITALS — BP 130/80 | HR 79 | Temp 98.0°F | Ht 63.5 in | Wt 143.0 lb

## 2012-08-07 DIAGNOSIS — E785 Hyperlipidemia, unspecified: Secondary | ICD-10-CM | POA: Diagnosis not present

## 2012-08-07 DIAGNOSIS — M949 Disorder of cartilage, unspecified: Secondary | ICD-10-CM

## 2012-08-07 DIAGNOSIS — M899 Disorder of bone, unspecified: Secondary | ICD-10-CM | POA: Diagnosis not present

## 2012-08-07 DIAGNOSIS — E039 Hypothyroidism, unspecified: Secondary | ICD-10-CM | POA: Diagnosis not present

## 2012-08-07 DIAGNOSIS — G4700 Insomnia, unspecified: Secondary | ICD-10-CM

## 2012-08-07 DIAGNOSIS — D649 Anemia, unspecified: Secondary | ICD-10-CM

## 2012-08-07 LAB — POCT URINALYSIS DIPSTICK
Glucose, UA: NEGATIVE
Nitrite, UA: NEGATIVE
Spec Grav, UA: 1.02
Urobilinogen, UA: 0.2

## 2012-08-07 LAB — CBC WITH DIFFERENTIAL/PLATELET
Basophils Absolute: 0.1 10*3/uL (ref 0.0–0.1)
Eosinophils Relative: 5.5 % — ABNORMAL HIGH (ref 0.0–5.0)
Monocytes Relative: 10.9 % (ref 3.0–12.0)
Neutrophils Relative %: 53.3 % (ref 43.0–77.0)
Platelets: 229 10*3/uL (ref 150.0–400.0)
WBC: 5.9 10*3/uL (ref 4.5–10.5)

## 2012-08-07 LAB — LIPID PANEL
HDL: 88.2 mg/dL (ref >39.00)
Total CHOL/HDL Ratio: 3
VLDL: 23.8 mg/dL (ref 0.0–40.0)

## 2012-08-07 LAB — BASIC METABOLIC PANEL
GFR: 86.22 mL/min (ref >60.00)
Potassium: 5 mEq/L (ref 3.5–5.1)
Sodium: 136 mEq/L (ref 135–145)

## 2012-08-07 LAB — HEPATIC FUNCTION PANEL
Albumin: 3.8 g/dL (ref 3.5–5.2)
Bilirubin, Direct: 0 mg/dL (ref 0.0–0.3)
Total Protein: 6.9 g/dL (ref 6.0–8.3)

## 2012-08-07 LAB — TSH: TSH: 1.47 u[IU]/mL (ref 0.35–5.50)

## 2012-08-07 MED ORDER — ZOLPIDEM TARTRATE 10 MG PO TABS: 10.0000 mg | ORAL_TABLET | Freq: Every evening | ORAL | Status: DC | PRN

## 2012-08-07 NOTE — Progress Notes (Signed)
  Subjective:    Patient ID: Katherine Holland, female    DOB: June 08, 1940, 72 y.o.   MRN: [id number]  HPI 72 yr old female for a cpx. She feels great and has no concerns.    Review of Systems  Constitutional: Negative.   HENT: Negative.   Eyes: Negative.   Respiratory: Negative.   Cardiovascular: Negative.   Gastrointestinal: Negative.   Genitourinary: Negative for dysuria, urgency, frequency, hematuria, flank pain, decreased urine volume, enuresis, difficulty urinating, pelvic pain and dyspareunia.  Musculoskeletal: Negative.   Skin: Negative.   Neurological: Negative.   Psychiatric/Behavioral: Negative.        Objective:   Physical Exam  Constitutional: She is oriented to person, place, and time. She appears well-developed and well-nourished. No distress.  HENT:  Head: Normocephalic and atraumatic.  Right Ear: External ear normal.  Left Ear: External ear normal.  Nose: Nose normal.  Mouth/Throat: Oropharynx is clear and moist. No oropharyngeal exudate.  Eyes: Conjunctivae and EOM are normal. Pupils are equal, round, and reactive to light. No scleral icterus.  Neck: Normal range of motion. Neck supple. No JVD present. No thyromegaly present.  Cardiovascular: Normal rate, regular rhythm, normal heart sounds and intact distal pulses.  Exam reveals no gallop and no friction rub.   No murmur heard. EKG normal   Pulmonary/Chest: Effort normal and breath sounds normal. No respiratory distress. She has no wheezes. She has no rales. She exhibits no tenderness.  Abdominal: Soft. Bowel sounds are normal. She exhibits no distension and no mass. There is no tenderness. There is no rebound and no guarding.  Musculoskeletal: Normal range of motion. She exhibits no edema and no tenderness.  Lymphadenopathy:    She has no cervical adenopathy.  Neurological: She is alert and oriented to person, place, and time. She has normal reflexes. No cranial nerve deficit. She exhibits normal muscle  tone. Coordination normal.  Skin: Skin is warm and dry. No rash noted. No erythema.  Psychiatric: She has a normal mood and affect. Her behavior is normal. Judgment and thought content normal.          Assessment & Plan:  Well exam. Get fasting labs today

## 2012-08-08 LAB — LDL CHOLESTEROL, DIRECT: Direct LDL: 114.9 mg/dL

## 2012-08-10 NOTE — Progress Notes (Signed)
Quick Note:  Left a message for pt to return call. ______ 

## 2012-08-13 NOTE — Progress Notes (Signed)
Quick Note:  I released results in my chart. ______ 

## 2012-11-05 DIAGNOSIS — H2510 Age-related nuclear cataract, unspecified eye: Secondary | ICD-10-CM | POA: Diagnosis not present

## 2012-11-05 DIAGNOSIS — H16109 Unspecified superficial keratitis, unspecified eye: Secondary | ICD-10-CM | POA: Diagnosis not present

## 2012-11-05 DIAGNOSIS — H52209 Unspecified astigmatism, unspecified eye: Secondary | ICD-10-CM | POA: Diagnosis not present

## 2012-11-05 DIAGNOSIS — H04129 Dry eye syndrome of unspecified lacrimal gland: Secondary | ICD-10-CM | POA: Diagnosis not present

## 2012-11-07 ENCOUNTER — Other Ambulatory Visit: Payer: Self-pay

## 2013-01-29 ENCOUNTER — Ambulatory Visit (INDEPENDENT_AMBULATORY_CARE_PROVIDER_SITE_OTHER): Payer: Medicare Other

## 2013-01-29 DIAGNOSIS — Z23 Encounter for immunization: Secondary | ICD-10-CM | POA: Diagnosis not present

## 2013-01-30 ENCOUNTER — Other Ambulatory Visit: Payer: Self-pay | Admitting: Family Medicine

## 2013-01-31 NOTE — Telephone Encounter (Signed)
Call in #90 with one rf 

## 2013-04-09 ENCOUNTER — Telehealth: Payer: Self-pay | Admitting: Family Medicine

## 2013-04-09 NOTE — Telephone Encounter (Signed)
Refill request for Premarin, Progesterone, & Altace. Pt changing pharmacies, send in a 90 day supply to [place]. Can we refill these?

## 2013-04-11 MED ORDER — ESTROGENS CONJUGATED 0.3 MG PO TABS: 0.3000 mg | ORAL_TABLET | Freq: Every day | ORAL | Status: DC

## 2013-04-11 MED ORDER — RAMIPRIL 2.5 MG PO CAPS: 2.5000 mg | ORAL_CAPSULE | Freq: Every day | ORAL | Status: DC

## 2013-04-11 NOTE — Telephone Encounter (Signed)
I spoke with pt and she only needs the Premarin & Altace right now. I did send both scripts e-scribe.

## 2013-04-11 NOTE — Telephone Encounter (Signed)
Refill all these for one year  

## 2013-06-10 ENCOUNTER — Telehealth: Payer: Self-pay | Admitting: Family Medicine

## 2013-06-10 MED ORDER — PROGESTERONE MICRONIZED 100 MG PO CAPS: 100.0000 mg | ORAL_CAPSULE | Freq: Every day | ORAL | Status: DC

## 2013-06-10 NOTE — Telephone Encounter (Signed)
Rx sent to pharmacy   

## 2013-06-10 NOTE — Telephone Encounter (Signed)
Refill #90 with 3 rf

## 2013-06-10 NOTE — Telephone Encounter (Signed)
Refill for Progesterone and a 90 day supply to [facility].

## 2013-06-22 DIAGNOSIS — J029 Acute pharyngitis, unspecified: Secondary | ICD-10-CM | POA: Diagnosis not present

## 2013-06-22 DIAGNOSIS — J309 Allergic rhinitis, unspecified: Secondary | ICD-10-CM | POA: Diagnosis not present

## 2013-06-24 ENCOUNTER — Other Ambulatory Visit: Payer: Self-pay

## 2013-06-24 DIAGNOSIS — Z1231 Encounter for screening mammogram for malignant neoplasm of breast: Secondary | ICD-10-CM

## 2013-07-08 DIAGNOSIS — H903 Sensorineural hearing loss, bilateral: Secondary | ICD-10-CM | POA: Diagnosis not present

## 2013-07-08 DIAGNOSIS — H6120 Impacted cerumen, unspecified ear: Secondary | ICD-10-CM | POA: Diagnosis not present

## 2013-07-29 ENCOUNTER — Other Ambulatory Visit: Payer: Self-pay | Admitting: Family Medicine

## 2013-07-29 NOTE — Telephone Encounter (Signed)
Call in #90 with 1 rf  

## 2013-08-02 DIAGNOSIS — Z124 Encounter for screening for malignant neoplasm of cervix: Secondary | ICD-10-CM | POA: Diagnosis not present

## 2013-08-02 DIAGNOSIS — Z01419 Encounter for gynecological examination (general) (routine) without abnormal findings: Secondary | ICD-10-CM | POA: Diagnosis not present

## 2013-08-02 NOTE — Telephone Encounter (Signed)
See my note

## 2013-08-05 ENCOUNTER — Ambulatory Visit
Admission: RE | Admit: 2013-08-05 | Discharge: 2013-08-05 | Disposition: A | Discharge status: Home / Self Care | Payer: Medicare Other | Source: Ambulatory Visit

## 2013-08-05 DIAGNOSIS — Z1231 Encounter for screening mammogram for malignant neoplasm of breast: Secondary | ICD-10-CM

## 2013-08-13 ENCOUNTER — Encounter: Payer: Self-pay | Admitting: Family Medicine

## 2013-08-13 ENCOUNTER — Ambulatory Visit (INDEPENDENT_AMBULATORY_CARE_PROVIDER_SITE_OTHER): Payer: Medicare Other | Admitting: Family Medicine

## 2013-08-13 VITALS — BP 130/84 | HR 60 | Temp 98.0°F | Ht 63.0 in | Wt 138.0 lb

## 2013-08-13 DIAGNOSIS — D649 Anemia, unspecified: Secondary | ICD-10-CM

## 2013-08-13 DIAGNOSIS — E039 Hypothyroidism, unspecified: Secondary | ICD-10-CM

## 2013-08-13 DIAGNOSIS — I10 Essential (primary) hypertension: Secondary | ICD-10-CM

## 2013-08-13 DIAGNOSIS — M949 Disorder of cartilage, unspecified: Secondary | ICD-10-CM

## 2013-08-13 DIAGNOSIS — M899 Disorder of bone, unspecified: Secondary | ICD-10-CM | POA: Diagnosis not present

## 2013-08-13 DIAGNOSIS — Z23 Encounter for immunization: Secondary | ICD-10-CM

## 2013-08-13 DIAGNOSIS — E785 Hyperlipidemia, unspecified: Secondary | ICD-10-CM | POA: Diagnosis not present

## 2013-08-13 LAB — LIPID PANEL
CHOL/HDL RATIO: 2
Cholesterol: 208 mg/dL — ABNORMAL HIGH (ref 0–200)
HDL: 86.4 mg/dL (ref >39.00)
LDL Cholesterol: 105 mg/dL — ABNORMAL HIGH (ref 0–99)
Triglycerides: 82 mg/dL (ref 0.0–149.0)
VLDL: 16.4 mg/dL (ref 0.0–40.0)

## 2013-08-13 LAB — HEPATIC FUNCTION PANEL
ALT: 14 U/L (ref 0–35)
AST: 19 U/L (ref 0–37)
Albumin: 3.9 g/dL (ref 3.5–5.2)
Alkaline Phosphatase: 46 U/L (ref 39–117)
BILIRUBIN TOTAL: 0.7 mg/dL (ref 0.2–1.2)
Bilirubin, Direct: 0 mg/dL (ref 0.0–0.3)
Total Protein: 6.7 g/dL (ref 6.0–8.3)

## 2013-08-13 LAB — POCT URINALYSIS DIPSTICK
BILIRUBIN UA: NEGATIVE
Glucose, UA: NEGATIVE
KETONES UA: NEGATIVE
Nitrite, UA: NEGATIVE
Protein, UA: NEGATIVE
RBC UA: NEGATIVE
Spec Grav, UA: 1.015
Urobilinogen, UA: 0.2
pH, UA: 7

## 2013-08-13 LAB — CBC WITH DIFFERENTIAL/PLATELET
BASOS PCT: 0.8 % (ref 0.0–3.0)
Basophils Absolute: 0 10*3/uL (ref 0.0–0.1)
EOS ABS: 0.3 10*3/uL (ref 0.0–0.7)
EOS PCT: 6 % — AB (ref 0.0–5.0)
HCT: 40 % (ref 36.0–46.0)
HEMOGLOBIN: 13.4 g/dL (ref 12.0–15.0)
LYMPHS PCT: 23.2 % (ref 12.0–46.0)
Lymphs Abs: 1.3 10*3/uL (ref 0.7–4.0)
MCHC: 33.6 g/dL (ref 30.0–36.0)
MCV: 91.6 fl (ref 78.0–100.0)
Monocytes Absolute: 0.6 10*3/uL (ref 0.1–1.0)
Monocytes Relative: 11.1 % (ref 3.0–12.0)
NEUTROS ABS: 3.3 10*3/uL (ref 1.4–7.7)
Neutrophils Relative %: 58.9 % (ref 43.0–77.0)
Platelets: 216 10*3/uL (ref 150.0–400.0)
RBC: 4.37 Mil/uL (ref 3.87–5.11)
RDW: 14.2 % (ref 11.5–15.5)
WBC: 5.6 10*3/uL (ref 4.0–10.5)

## 2013-08-13 LAB — BASIC METABOLIC PANEL
BUN: 14 mg/dL (ref 6–23)
CO2: 25 mEq/L (ref 19–32)
Calcium: 9 mg/dL (ref 8.4–10.5)
Chloride: 105 mEq/L (ref 96–112)
Creatinine, Ser: 0.7 mg/dL (ref 0.4–1.2)
GFR: 87.39 mL/min (ref >60.00)
Glucose, Bld: 86 mg/dL (ref 70–99)
Potassium: 4.1 mEq/L (ref 3.5–5.1)
SODIUM: 137 meq/L (ref 135–145)

## 2013-08-13 LAB — TSH: TSH: 2.45 u[IU]/mL (ref 0.35–4.50)

## 2013-08-13 NOTE — Addendum Note (Signed)
Addended by: Townsend Roger D on: 08/13/2013 02:45 PM   Modules accepted: Orders

## 2013-08-13 NOTE — Progress Notes (Signed)
Pre visit review using our clinic review tool, if applicable. No additional management support is needed unless otherwise documented below in the visit note. 

## 2013-08-13 NOTE — Progress Notes (Signed)
   Subjective:    Patient ID: Katherine Holland, female    DOB: [birth year], 73 y.o.   MRN: [id number]  HPI 73 yr old female for a cpx. She feels well.    Review of Systems  Constitutional: Negative.   HENT: Negative.   Eyes: Negative.   Respiratory: Negative.   Cardiovascular: Negative.   Gastrointestinal: Negative.   Genitourinary: Negative for dysuria, urgency, frequency, hematuria, flank pain, decreased urine volume, enuresis, difficulty urinating, pelvic pain and dyspareunia.  Musculoskeletal: Negative.   Skin: Negative.   Neurological: Negative.   Psychiatric/Behavioral: Negative.        Objective:   Physical Exam  Constitutional: She is oriented to person, place, and time. She appears well-developed and well-nourished. No distress.  HENT:  Head: Normocephalic and atraumatic.  Right Ear: External ear normal.  Left Ear: External ear normal.  Nose: Nose normal.  Mouth/Throat: Oropharynx is clear and moist. No oropharyngeal exudate.  Eyes: Conjunctivae and EOM are normal. Pupils are equal, round, and reactive to light. No scleral icterus.  Neck: Normal range of motion. Neck supple. No JVD present. No thyromegaly present.  Cardiovascular: Normal rate, regular rhythm, normal heart sounds and intact distal pulses.  Exam reveals no gallop and no friction rub.   No murmur heard. EKG normal   Pulmonary/Chest: Effort normal and breath sounds normal. No respiratory distress. She has no wheezes. She has no rales. She exhibits no tenderness.  Abdominal: Soft. Bowel sounds are normal. She exhibits no distension and no mass. There is no tenderness. There is no rebound and no guarding.  Musculoskeletal: Normal range of motion. She exhibits no edema and no tenderness.  Lymphadenopathy:    She has no cervical adenopathy.  Neurological: She is alert and oriented to person, place, and time. She has normal reflexes. No cranial nerve deficit. She exhibits normal muscle tone. Coordination  normal.  Skin: Skin is warm and dry. No rash noted. No erythema.  Psychiatric: She has a normal mood and affect. Her behavior is normal. Judgment and thought content normal.          Assessment & Plan:  Well exam. Get fasting labs

## 2013-08-14 LAB — VITAMIN D 25 HYDROXY (VIT D DEFICIENCY, FRACTURES): Vit D, 25-Hydroxy: 46 ng/mL (ref 30–89)

## 2013-09-18 ENCOUNTER — Other Ambulatory Visit: Payer: Self-pay | Admitting: Dermatology

## 2013-09-18 DIAGNOSIS — L819 Disorder of pigmentation, unspecified: Secondary | ICD-10-CM | POA: Diagnosis not present

## 2013-09-18 DIAGNOSIS — L821 Other seborrheic keratosis: Secondary | ICD-10-CM | POA: Diagnosis not present

## 2013-09-18 DIAGNOSIS — D1801 Hemangioma of skin and subcutaneous tissue: Secondary | ICD-10-CM | POA: Diagnosis not present

## 2013-09-18 DIAGNOSIS — D2330 Other benign neoplasm of skin of unspecified part of face: Secondary | ICD-10-CM | POA: Diagnosis not present

## 2013-09-18 DIAGNOSIS — D485 Neoplasm of uncertain behavior of skin: Secondary | ICD-10-CM | POA: Diagnosis not present

## 2013-09-18 DIAGNOSIS — D239 Other benign neoplasm of skin, unspecified: Secondary | ICD-10-CM | POA: Diagnosis not present

## 2013-11-06 DIAGNOSIS — H02059 Trichiasis without entropian unspecified eye, unspecified eyelid: Secondary | ICD-10-CM | POA: Diagnosis not present

## 2013-11-06 DIAGNOSIS — H16109 Unspecified superficial keratitis, unspecified eye: Secondary | ICD-10-CM | POA: Diagnosis not present

## 2013-11-06 DIAGNOSIS — H04129 Dry eye syndrome of unspecified lacrimal gland: Secondary | ICD-10-CM | POA: Diagnosis not present

## 2013-11-06 DIAGNOSIS — H43819 Vitreous degeneration, unspecified eye: Secondary | ICD-10-CM | POA: Diagnosis not present

## 2014-01-06 DIAGNOSIS — Z23 Encounter for immunization: Secondary | ICD-10-CM | POA: Diagnosis not present

## 2014-01-18 ENCOUNTER — Other Ambulatory Visit: Payer: Self-pay | Admitting: Family Medicine

## 2014-01-20 NOTE — Telephone Encounter (Signed)
Refill for 6 months. 

## 2014-03-31 ENCOUNTER — Other Ambulatory Visit: Payer: Self-pay | Admitting: Family Medicine

## 2014-04-01 ENCOUNTER — Other Ambulatory Visit: Payer: Self-pay | Admitting: Family Medicine

## 2014-06-17 ENCOUNTER — Other Ambulatory Visit: Payer: Self-pay | Admitting: Family Medicine

## 2014-07-07 ENCOUNTER — Other Ambulatory Visit: Payer: Self-pay

## 2014-07-07 DIAGNOSIS — Z1231 Encounter for screening mammogram for malignant neoplasm of breast: Secondary | ICD-10-CM

## 2014-07-18 ENCOUNTER — Other Ambulatory Visit: Payer: Self-pay | Admitting: Family Medicine

## 2014-07-21 NOTE — Telephone Encounter (Signed)
Duplicate request

## 2014-07-21 NOTE — Telephone Encounter (Signed)
Refill for 6 months. 

## 2014-08-12 ENCOUNTER — Ambulatory Visit
Admission: RE | Admit: 2014-08-12 | Discharge: 2014-08-12 | Disposition: A | Discharge status: Home / Self Care | Payer: Medicare Other | Source: Ambulatory Visit

## 2014-08-12 DIAGNOSIS — Z1231 Encounter for screening mammogram for malignant neoplasm of breast: Secondary | ICD-10-CM

## 2014-08-14 ENCOUNTER — Encounter: Payer: Self-pay | Admitting: Family Medicine

## 2014-08-14 ENCOUNTER — Ambulatory Visit (INDEPENDENT_AMBULATORY_CARE_PROVIDER_SITE_OTHER): Payer: Medicare Other | Admitting: Family Medicine

## 2014-08-14 VITALS — BP 152/92 | HR 73 | Temp 98.3°F | Ht 63.0 in | Wt 138.0 lb

## 2014-08-14 DIAGNOSIS — G4700 Insomnia, unspecified: Secondary | ICD-10-CM

## 2014-08-14 DIAGNOSIS — I10 Essential (primary) hypertension: Secondary | ICD-10-CM

## 2014-08-14 DIAGNOSIS — D509 Iron deficiency anemia, unspecified: Secondary | ICD-10-CM

## 2014-08-14 DIAGNOSIS — E785 Hyperlipidemia, unspecified: Secondary | ICD-10-CM | POA: Diagnosis not present

## 2014-08-14 LAB — CBC WITH DIFFERENTIAL/PLATELET
BASOS ABS: 0 10*3/uL (ref 0.0–0.1)
Basophils Relative: 0.6 % (ref 0.0–3.0)
EOS ABS: 0.3 10*3/uL (ref 0.0–0.7)
Eosinophils Relative: 4.5 % (ref 0.0–5.0)
HEMATOCRIT: 42.8 % (ref 36.0–46.0)
HEMOGLOBIN: 14.4 g/dL (ref 12.0–15.0)
LYMPHS ABS: 1.5 10*3/uL (ref 0.7–4.0)
Lymphocytes Relative: 24.5 % (ref 12.0–46.0)
MCHC: 33.6 g/dL (ref 30.0–36.0)
MCV: 87.9 fl (ref 78.0–100.0)
MONOS PCT: 11.2 % (ref 3.0–12.0)
Monocytes Absolute: 0.7 10*3/uL (ref 0.1–1.0)
Neutro Abs: 3.6 10*3/uL (ref 1.4–7.7)
Neutrophils Relative %: 59.2 % (ref 43.0–77.0)
Platelets: 250 10*3/uL (ref 150.0–400.0)
RBC: 4.87 Mil/uL (ref 3.87–5.11)
RDW: 14.8 % (ref 11.5–15.5)
WBC: 6.2 10*3/uL (ref 4.0–10.5)

## 2014-08-14 LAB — POCT URINALYSIS DIPSTICK
BILIRUBIN UA: NEGATIVE
Glucose, UA: NEGATIVE
Ketones, UA: NEGATIVE
Nitrite, UA: NEGATIVE
PH UA: 5.5
Protein, UA: NEGATIVE
RBC UA: NEGATIVE
SPEC GRAV UA: 1.02
Urobilinogen, UA: 0.2

## 2014-08-14 LAB — BASIC METABOLIC PANEL
BUN: 15 mg/dL (ref 6–23)
CHLORIDE: 104 meq/L (ref 96–112)
CO2: 28 meq/L (ref 19–32)
Calcium: 9.9 mg/dL (ref 8.4–10.5)
Creatinine, Ser: 0.79 mg/dL (ref 0.40–1.20)
GFR: 75.79 mL/min (ref >60.00)
Glucose, Bld: 87 mg/dL (ref 70–99)
Potassium: 5.2 mEq/L — ABNORMAL HIGH (ref 3.5–5.1)
Sodium: 139 mEq/L (ref 135–145)

## 2014-08-14 LAB — LIPID PANEL
Cholesterol: 225 mg/dL — ABNORMAL HIGH (ref 0–200)
HDL: 91.1 mg/dL (ref >39.00)
LDL CALC: 116 mg/dL — AB (ref 0–99)
NonHDL: 133.9
Total CHOL/HDL Ratio: 2
Triglycerides: 92 mg/dL (ref 0.0–149.0)
VLDL: 18.4 mg/dL (ref 0.0–40.0)

## 2014-08-14 LAB — TSH: TSH: 2 u[IU]/mL (ref 0.35–4.50)

## 2014-08-14 LAB — HEPATIC FUNCTION PANEL
ALK PHOS: 58 U/L (ref 39–117)
ALT: 12 U/L (ref 0–35)
AST: 16 U/L (ref 0–37)
Albumin: 4 g/dL (ref 3.5–5.2)
BILIRUBIN DIRECT: 0.1 mg/dL (ref 0.0–0.3)
Total Bilirubin: 0.5 mg/dL (ref 0.2–1.2)
Total Protein: 7.1 g/dL (ref 6.0–8.3)

## 2014-08-14 MED ORDER — RAMIPRIL 5 MG PO CAPS: 5.0000 mg | ORAL_CAPSULE | Freq: Every day | ORAL | Status: DC

## 2014-08-14 NOTE — Progress Notes (Signed)
   Subjective:    Patient ID: Katherine Holland, female    DOB: [birth year], 74 y.o.   MRN: [id number]  HPI 74 yr old female to follow up several issues. She does not check her BP at home but we found it to be high today. She feels fine and she still walks 45-60 minutes every day. She had her mammogram early this week. She is sleeping well.    Review of Systems  Constitutional: Negative.   HENT: Negative.   Eyes: Negative.   Respiratory: Negative.   Cardiovascular: Negative.   Gastrointestinal: Negative.   Genitourinary: Negative for dysuria, urgency, frequency, hematuria, flank pain, decreased urine volume, enuresis, difficulty urinating, pelvic pain and dyspareunia.  Musculoskeletal: Negative.   Skin: Negative.   Neurological: Negative.   Psychiatric/Behavioral: Negative.        Objective:   Physical Exam  Constitutional: She is oriented to person, place, and time. She appears well-developed and well-nourished. No distress.  HENT:  Head: Normocephalic and atraumatic.  Right Ear: External ear normal.  Left Ear: External ear normal.  Nose: Nose normal.  Mouth/Throat: Oropharynx is clear and moist. No oropharyngeal exudate.  Eyes: Conjunctivae and EOM are normal. Pupils are equal, round, and reactive to light. No scleral icterus.  Neck: Normal range of motion. Neck supple. No JVD present. No thyromegaly present.  Cardiovascular: Normal rate, regular rhythm, normal heart sounds and intact distal pulses.  Exam reveals no gallop and no friction rub.   No murmur heard. EKG normal with a single PVC  Pulmonary/Chest: Effort normal and breath sounds normal. No respiratory distress. She has no wheezes. She has no rales. She exhibits no tenderness.  Abdominal: Soft. Bowel sounds are normal. She exhibits no distension and no mass. There is no tenderness. There is no rebound and no guarding.  Musculoskeletal: Normal range of motion. She exhibits no edema or tenderness.  Lymphadenopathy:   She has no cervical adenopathy.  Neurological: She is alert and oriented to person, place, and time. She has normal reflexes. No cranial nerve deficit. She exhibits normal muscle tone. Coordination normal.  Skin: Skin is warm and dry. No rash noted. No erythema.  Psychiatric: She has a normal mood and affect. Her behavior is normal. Judgment and thought content normal.          Assessment & Plan:  Well exam. Get fasting labs to check the lipids, etc. We will increase the Ramipril to 5 mg daily. I suggested she begin checking her BP at home. Recheck with [place] in one month.

## 2014-08-14 NOTE — Progress Notes (Signed)
Pre visit review using our clinic review tool, if applicable. No additional management support is needed unless otherwise documented below in the visit note. 

## 2014-08-15 DIAGNOSIS — Z124 Encounter for screening for malignant neoplasm of cervix: Secondary | ICD-10-CM | POA: Diagnosis not present

## 2014-08-15 DIAGNOSIS — Z01419 Encounter for gynecological examination (general) (routine) without abnormal findings: Secondary | ICD-10-CM | POA: Diagnosis not present

## 2014-09-29 ENCOUNTER — Other Ambulatory Visit: Payer: Self-pay

## 2014-11-10 DIAGNOSIS — H16103 Unspecified superficial keratitis, bilateral: Secondary | ICD-10-CM | POA: Diagnosis not present

## 2014-11-10 DIAGNOSIS — H25813 Combined forms of age-related cataract, bilateral: Secondary | ICD-10-CM | POA: Diagnosis not present

## 2014-11-10 DIAGNOSIS — H43813 Vitreous degeneration, bilateral: Secondary | ICD-10-CM | POA: Diagnosis not present

## 2014-11-10 DIAGNOSIS — H04123 Dry eye syndrome of bilateral lacrimal glands: Secondary | ICD-10-CM | POA: Diagnosis not present

## 2014-11-12 DIAGNOSIS — D485 Neoplasm of uncertain behavior of skin: Secondary | ICD-10-CM | POA: Diagnosis not present

## 2014-11-12 DIAGNOSIS — D2239 Melanocytic nevi of other parts of face: Secondary | ICD-10-CM | POA: Diagnosis not present

## 2014-11-12 DIAGNOSIS — D1801 Hemangioma of skin and subcutaneous tissue: Secondary | ICD-10-CM | POA: Diagnosis not present

## 2014-11-12 DIAGNOSIS — L821 Other seborrheic keratosis: Secondary | ICD-10-CM | POA: Diagnosis not present

## 2014-11-12 DIAGNOSIS — D225 Melanocytic nevi of trunk: Secondary | ICD-10-CM | POA: Diagnosis not present

## 2015-01-15 ENCOUNTER — Other Ambulatory Visit: Payer: Self-pay | Admitting: Family Medicine

## 2015-01-15 DIAGNOSIS — Z23 Encounter for immunization: Secondary | ICD-10-CM | POA: Diagnosis not present

## 2015-01-16 NOTE — Telephone Encounter (Signed)
Can we refill, pt will run out before Monday?

## 2015-01-16 NOTE — Telephone Encounter (Signed)
OK to refill for one month 

## 2015-02-18 ENCOUNTER — Other Ambulatory Visit: Payer: Self-pay | Admitting: Adult Health

## 2015-02-19 ENCOUNTER — Other Ambulatory Visit: Payer: Self-pay | Admitting: Adult Health

## 2015-02-19 NOTE — Telephone Encounter (Signed)
Refill for 6 months. 

## 2015-02-20 NOTE — Telephone Encounter (Signed)
Call in #90 with one rf 

## 2015-02-23 MED ORDER — ZOLPIDEM TARTRATE 10 MG PO TABS: 10.0000 mg | ORAL_TABLET | Freq: Every day | ORAL | Status: DC

## 2015-03-10 ENCOUNTER — Encounter: Payer: Self-pay | Admitting: Family Medicine

## 2015-03-24 ENCOUNTER — Other Ambulatory Visit: Payer: Self-pay | Admitting: Family Medicine

## 2015-04-17 ENCOUNTER — Other Ambulatory Visit: Payer: Self-pay

## 2015-04-17 DIAGNOSIS — Z1231 Encounter for screening mammogram for malignant neoplasm of breast: Secondary | ICD-10-CM

## 2015-06-22 ENCOUNTER — Telehealth: Payer: Self-pay | Admitting: Family Medicine

## 2015-06-22 MED ORDER — PROGESTERONE MICRONIZED 100 MG PO CAPS: 100.0000 mg | ORAL_CAPSULE | Freq: Every day | ORAL | Status: DC

## 2015-06-22 NOTE — Telephone Encounter (Signed)
done

## 2015-08-12 ENCOUNTER — Other Ambulatory Visit: Payer: Self-pay | Admitting: Family Medicine

## 2015-08-12 ENCOUNTER — Encounter: Payer: Self-pay | Admitting: Family Medicine

## 2015-08-12 NOTE — Telephone Encounter (Signed)
Call in #30 with 2 rf. She needs an OV soon  

## 2015-08-14 ENCOUNTER — Ambulatory Visit
Admission: RE | Admit: 2015-08-14 | Discharge: 2015-08-14 | Disposition: A | Discharge status: Home / Self Care | Payer: Medicare Other | Source: Ambulatory Visit

## 2015-08-14 DIAGNOSIS — Z1231 Encounter for screening mammogram for malignant neoplasm of breast: Secondary | ICD-10-CM

## 2015-08-17 ENCOUNTER — Ambulatory Visit (INDEPENDENT_AMBULATORY_CARE_PROVIDER_SITE_OTHER): Payer: Medicare Other | Admitting: Family Medicine

## 2015-08-17 ENCOUNTER — Encounter: Payer: Self-pay | Admitting: Family Medicine

## 2015-08-17 VITALS — BP 118/83 | HR 79 | Temp 98.3°F | Ht 63.0 in | Wt 137.0 lb

## 2015-08-17 DIAGNOSIS — I10 Essential (primary) hypertension: Secondary | ICD-10-CM

## 2015-08-17 DIAGNOSIS — M899 Disorder of bone, unspecified: Secondary | ICD-10-CM | POA: Diagnosis not present

## 2015-08-17 DIAGNOSIS — E785 Hyperlipidemia, unspecified: Secondary | ICD-10-CM | POA: Diagnosis not present

## 2015-08-17 DIAGNOSIS — D509 Iron deficiency anemia, unspecified: Secondary | ICD-10-CM | POA: Diagnosis not present

## 2015-08-17 DIAGNOSIS — Z79890 Hormone replacement therapy (postmenopausal): Secondary | ICD-10-CM

## 2015-08-17 DIAGNOSIS — M949 Disorder of cartilage, unspecified: Secondary | ICD-10-CM

## 2015-08-17 LAB — BASIC METABOLIC PANEL
BUN: 18 mg/dL (ref 6–23)
CHLORIDE: 104 meq/L (ref 96–112)
CO2: 27 mEq/L (ref 19–32)
Calcium: 9.3 mg/dL (ref 8.4–10.5)
Creatinine, Ser: 0.76 mg/dL (ref 0.40–1.20)
GFR: 79.03 mL/min (ref >60.00)
GLUCOSE: 94 mg/dL (ref 70–99)
POTASSIUM: 4.4 meq/L (ref 3.5–5.1)
SODIUM: 138 meq/L (ref 135–145)

## 2015-08-17 LAB — POC URINALSYSI DIPSTICK (AUTOMATED)
BILIRUBIN UA: NEGATIVE
Blood, UA: NEGATIVE
GLUCOSE UA: NEGATIVE
Ketones, UA: NEGATIVE
Nitrite, UA: NEGATIVE
PROTEIN UA: NEGATIVE
SPEC GRAV UA: 1.02
Urobilinogen, UA: 0.2
pH, UA: 6

## 2015-08-17 LAB — LIPID PANEL
CHOLESTEROL: 222 mg/dL — AB (ref 0–200)
HDL: 66.5 mg/dL (ref >39.00)
LDL Cholesterol: 129 mg/dL — ABNORMAL HIGH (ref 0–99)
NonHDL: 155.29
TRIGLYCERIDES: 129 mg/dL (ref 0.0–149.0)
Total CHOL/HDL Ratio: 3
VLDL: 25.8 mg/dL (ref 0.0–40.0)

## 2015-08-17 LAB — HEPATIC FUNCTION PANEL
ALK PHOS: 52 U/L (ref 39–117)
ALT: 11 U/L (ref 0–35)
AST: 16 U/L (ref 0–37)
Albumin: 4 g/dL (ref 3.5–5.2)
Bilirubin, Direct: 0.1 mg/dL (ref 0.0–0.3)
TOTAL PROTEIN: 6.5 g/dL (ref 6.0–8.3)
Total Bilirubin: 0.6 mg/dL (ref 0.2–1.2)

## 2015-08-17 LAB — CBC WITH DIFFERENTIAL/PLATELET
BASOS PCT: 1.1 % (ref 0.0–3.0)
Basophils Absolute: 0.1 10*3/uL (ref 0.0–0.1)
EOS PCT: 4.7 % (ref 0.0–5.0)
Eosinophils Absolute: 0.3 10*3/uL (ref 0.0–0.7)
HCT: 40.4 % (ref 36.0–46.0)
Hemoglobin: 13.5 g/dL (ref 12.0–15.0)
LYMPHS ABS: 1.3 10*3/uL (ref 0.7–4.0)
Lymphocytes Relative: 22.6 % (ref 12.0–46.0)
MCHC: 33.4 g/dL (ref 30.0–36.0)
MCV: 86.9 fl (ref 78.0–100.0)
MONOS PCT: 11.2 % (ref 3.0–12.0)
Monocytes Absolute: 0.6 10*3/uL (ref 0.1–1.0)
Neutro Abs: 3.5 10*3/uL (ref 1.4–7.7)
Neutrophils Relative %: 60.4 % (ref 43.0–77.0)
Platelets: 222 10*3/uL (ref 150.0–400.0)
RBC: 4.65 Mil/uL (ref 3.87–5.11)
RDW: 15.5 % (ref 11.5–15.5)
WBC: 5.8 10*3/uL (ref 4.0–10.5)

## 2015-08-17 LAB — TSH: TSH: 2.47 u[IU]/mL (ref 0.35–4.50)

## 2015-08-17 MED ORDER — ESTROGENS CONJUGATED 0.3 MG PO TABS: 0.3000 mg | ORAL_TABLET | Freq: Every day | ORAL | Status: DC

## 2015-08-17 MED ORDER — ZOLPIDEM TARTRATE 10 MG PO TABS: ORAL_TABLET | ORAL | Status: DC

## 2015-08-17 MED ORDER — RAMIPRIL 5 MG PO CAPS: 5.0000 mg | ORAL_CAPSULE | Freq: Every day | ORAL | Status: DC

## 2015-08-17 NOTE — Progress Notes (Signed)
   Subjective:    Patient ID: Katherine Holland, female    DOB: [birth year], 75 y.o.   MRN: [id number]  HPI 75 yr old female to follow up on issues including HTN, hypothyroidism, and insomnia. Her BP has been stable. She feels great.    Review of Systems  Constitutional: Negative.   HENT: Negative.   Eyes: Negative.   Respiratory: Negative.   Cardiovascular: Negative.   Gastrointestinal: Negative.   Genitourinary: Negative for dysuria, urgency, frequency, hematuria, flank pain, decreased urine volume, enuresis, difficulty urinating, pelvic pain and dyspareunia.  Musculoskeletal: Negative.   Skin: Negative.   Neurological: Negative.   Psychiatric/Behavioral: Negative.        Objective:   Physical Exam  Constitutional: She is oriented to person, place, and time. She appears well-developed and well-nourished. No distress.  HENT:  Head: Normocephalic and atraumatic.  Right Ear: External ear normal.  Left Ear: External ear normal.  Nose: Nose normal.  Mouth/Throat: Oropharynx is clear and moist. No oropharyngeal exudate.  Eyes: Conjunctivae and EOM are normal. Pupils are equal, round, and reactive to light. No scleral icterus.  Neck: Normal range of motion. Neck supple. No JVD present. No thyromegaly present.  Cardiovascular: Normal rate, regular rhythm, normal heart sounds and intact distal pulses.  Exam reveals no gallop and no friction rub.   No murmur heard. EKG normal   Pulmonary/Chest: Effort normal and breath sounds normal. No respiratory distress. She has no wheezes. She has no rales. She exhibits no tenderness.  Abdominal: Soft. Bowel sounds are normal. She exhibits no distension and no mass. There is no tenderness. There is no rebound and no guarding.  Musculoskeletal: Normal range of motion. She exhibits no edema or tenderness.  Lymphadenopathy:    She has no cervical adenopathy.  Neurological: She is alert and oriented to person, place, and time. She has normal  reflexes. No cranial nerve deficit. She exhibits normal muscle tone. Coordination normal.  Skin: Skin is warm and dry. No rash noted. No erythema.  Psychiatric: She has a normal mood and affect. Her behavior is normal. Judgment and thought content normal.          Assessment & Plan:  Her HTN is stable. She will continue daily hormone replacement. Get fasting labs today. Set up a DEXA soon.  Laurey Morale, MD

## 2015-08-17 NOTE — Progress Notes (Signed)
Pre visit review using our clinic review tool, if applicable. No additional management support is needed unless otherwise documented below in the visit note. 

## 2015-08-20 ENCOUNTER — Ambulatory Visit
Admission: RE | Admit: 2015-08-20 | Discharge: 2015-08-20 | Disposition: A | Discharge status: Home / Self Care | Payer: Medicare Other | Source: Ambulatory Visit | Attending: Family Medicine | Admitting: Family Medicine

## 2015-08-20 DIAGNOSIS — M899 Disorder of bone, unspecified: Secondary | ICD-10-CM

## 2015-08-20 DIAGNOSIS — M85852 Other specified disorders of bone density and structure, left thigh: Secondary | ICD-10-CM | POA: Diagnosis not present

## 2015-08-20 DIAGNOSIS — M949 Disorder of cartilage, unspecified: Principal | ICD-10-CM

## 2015-08-20 DIAGNOSIS — Z780 Asymptomatic menopausal state: Secondary | ICD-10-CM | POA: Diagnosis not present

## 2015-08-25 DIAGNOSIS — Z01419 Encounter for gynecological examination (general) (routine) without abnormal findings: Secondary | ICD-10-CM | POA: Diagnosis not present

## 2015-08-25 DIAGNOSIS — Z124 Encounter for screening for malignant neoplasm of cervix: Secondary | ICD-10-CM | POA: Diagnosis not present

## 2015-09-05 ENCOUNTER — Encounter: Payer: Self-pay | Admitting: Family Medicine

## 2015-09-29 ENCOUNTER — Encounter: Payer: Self-pay | Admitting: Family Medicine

## 2015-10-01 NOTE — Telephone Encounter (Signed)
cahnge the Premarin to take daily, call in #90 with 3 rf

## 2015-10-02 MED ORDER — ESTROGENS CONJUGATED 0.3 MG PO TABS: 0.3000 mg | ORAL_TABLET | Freq: Every day | ORAL | Status: DC

## 2015-11-11 DIAGNOSIS — H25813 Combined forms of age-related cataract, bilateral: Secondary | ICD-10-CM | POA: Diagnosis not present

## 2015-11-11 DIAGNOSIS — H43812 Vitreous degeneration, left eye: Secondary | ICD-10-CM | POA: Diagnosis not present

## 2015-11-11 DIAGNOSIS — H04123 Dry eye syndrome of bilateral lacrimal glands: Secondary | ICD-10-CM | POA: Diagnosis not present

## 2015-11-11 DIAGNOSIS — Z0100 Encounter for examination of eyes and vision without abnormal findings: Secondary | ICD-10-CM | POA: Diagnosis not present

## 2016-01-20 ENCOUNTER — Ambulatory Visit (INDEPENDENT_AMBULATORY_CARE_PROVIDER_SITE_OTHER): Payer: Medicare Other

## 2016-01-20 DIAGNOSIS — D1801 Hemangioma of skin and subcutaneous tissue: Secondary | ICD-10-CM | POA: Diagnosis not present

## 2016-01-20 DIAGNOSIS — L820 Inflamed seborrheic keratosis: Secondary | ICD-10-CM | POA: Diagnosis not present

## 2016-01-20 DIAGNOSIS — Z23 Encounter for immunization: Secondary | ICD-10-CM | POA: Diagnosis not present

## 2016-01-20 DIAGNOSIS — L821 Other seborrheic keratosis: Secondary | ICD-10-CM | POA: Diagnosis not present

## 2016-01-20 DIAGNOSIS — L570 Actinic keratosis: Secondary | ICD-10-CM | POA: Diagnosis not present

## 2016-01-20 DIAGNOSIS — D2239 Melanocytic nevi of other parts of face: Secondary | ICD-10-CM | POA: Diagnosis not present

## 2016-01-20 DIAGNOSIS — D225 Melanocytic nevi of trunk: Secondary | ICD-10-CM | POA: Diagnosis not present

## 2016-03-01 ENCOUNTER — Other Ambulatory Visit: Payer: Self-pay | Admitting: Family Medicine

## 2016-03-02 ENCOUNTER — Encounter: Payer: Self-pay | Admitting: Family Medicine

## 2016-03-03 NOTE — Telephone Encounter (Signed)
Pt following up on request refill   zolpidem (AMBIEN) 10 MG tablet  [facility]

## 2016-03-03 NOTE — Telephone Encounter (Signed)
Call in #90 with one rf 

## 2016-04-18 ENCOUNTER — Other Ambulatory Visit: Payer: Self-pay | Admitting: Family Medicine

## 2016-04-18 DIAGNOSIS — Z1231 Encounter for screening mammogram for malignant neoplasm of breast: Secondary | ICD-10-CM

## 2016-06-28 ENCOUNTER — Other Ambulatory Visit: Payer: Self-pay | Admitting: Family Medicine

## 2016-08-15 ENCOUNTER — Ambulatory Visit
Admission: RE | Admit: 2016-08-15 | Discharge: 2016-08-15 | Disposition: A | Discharge status: Home / Self Care | Payer: Medicare Other | Source: Ambulatory Visit | Attending: Family Medicine | Admitting: Family Medicine

## 2016-08-15 DIAGNOSIS — Z1231 Encounter for screening mammogram for malignant neoplasm of breast: Secondary | ICD-10-CM | POA: Diagnosis not present

## 2016-08-17 ENCOUNTER — Encounter: Payer: Self-pay | Admitting: Family Medicine

## 2016-08-17 NOTE — Telephone Encounter (Signed)
Call in Zolpidem #90 with one rf 

## 2016-08-18 ENCOUNTER — Other Ambulatory Visit: Payer: Self-pay | Admitting: Family Medicine

## 2016-08-18 MED ORDER — ZOLPIDEM TARTRATE 10 MG PO TABS: ORAL_TABLET | ORAL | 1 refills | Status: DC

## 2016-08-22 ENCOUNTER — Encounter: Payer: Medicare Other | Admitting: Family Medicine

## 2016-08-30 ENCOUNTER — Encounter: Payer: Medicare Other | Admitting: Family Medicine

## 2016-08-31 ENCOUNTER — Encounter: Payer: Medicare Other | Admitting: Family Medicine

## 2016-09-02 ENCOUNTER — Other Ambulatory Visit: Payer: Self-pay | Admitting: Family Medicine

## 2016-09-08 ENCOUNTER — Encounter: Payer: Medicare Other | Admitting: Family Medicine

## 2016-09-14 DIAGNOSIS — Z124 Encounter for screening for malignant neoplasm of cervix: Secondary | ICD-10-CM | POA: Diagnosis not present

## 2016-09-15 ENCOUNTER — Encounter: Payer: Self-pay | Admitting: Family Medicine

## 2016-09-15 ENCOUNTER — Ambulatory Visit (INDEPENDENT_AMBULATORY_CARE_PROVIDER_SITE_OTHER): Payer: Medicare Other | Admitting: Family Medicine

## 2016-09-15 VITALS — BP 140/90 | HR 59 | Temp 98.3°F | Ht 63.0 in | Wt 136.0 lb

## 2016-09-15 DIAGNOSIS — G4700 Insomnia, unspecified: Secondary | ICD-10-CM | POA: Diagnosis not present

## 2016-09-15 DIAGNOSIS — Z23 Encounter for immunization: Secondary | ICD-10-CM

## 2016-09-15 DIAGNOSIS — D508 Other iron deficiency anemias: Secondary | ICD-10-CM | POA: Diagnosis not present

## 2016-09-15 DIAGNOSIS — E782 Mixed hyperlipidemia: Secondary | ICD-10-CM | POA: Diagnosis not present

## 2016-09-15 DIAGNOSIS — E039 Hypothyroidism, unspecified: Secondary | ICD-10-CM | POA: Diagnosis not present

## 2016-09-15 DIAGNOSIS — I10 Essential (primary) hypertension: Secondary | ICD-10-CM

## 2016-09-15 LAB — LIPID PANEL
CHOLESTEROL: 210 mg/dL — AB (ref 0–200)
HDL: 77.4 mg/dL (ref >39.00)
LDL CALC: 116 mg/dL — AB (ref 0–99)
NonHDL: 132.45
TRIGLYCERIDES: 84 mg/dL (ref 0.0–149.0)
Total CHOL/HDL Ratio: 3
VLDL: 16.8 mg/dL (ref 0.0–40.0)

## 2016-09-15 LAB — BASIC METABOLIC PANEL
BUN: 21 mg/dL (ref 6–23)
CALCIUM: 9.5 mg/dL (ref 8.4–10.5)
CO2: 27 mEq/L (ref 19–32)
CREATININE: 0.85 mg/dL (ref 0.40–1.20)
Chloride: 108 mEq/L (ref 96–112)
GFR: 69.25 mL/min (ref >60.00)
GLUCOSE: 91 mg/dL (ref 70–99)
Potassium: 5 mEq/L (ref 3.5–5.1)
SODIUM: 140 meq/L (ref 135–145)

## 2016-09-15 LAB — HEPATIC FUNCTION PANEL
ALBUMIN: 4.1 g/dL (ref 3.5–5.2)
ALT: 11 U/L (ref 0–35)
AST: 14 U/L (ref 0–37)
Alkaline Phosphatase: 52 U/L (ref 39–117)
Bilirubin, Direct: 0.1 mg/dL (ref 0.0–0.3)
Total Bilirubin: 0.5 mg/dL (ref 0.2–1.2)
Total Protein: 6.6 g/dL (ref 6.0–8.3)

## 2016-09-15 LAB — CBC WITH DIFFERENTIAL/PLATELET
BASOS ABS: 0.1 10*3/uL (ref 0.0–0.1)
Basophils Relative: 1.1 % (ref 0.0–3.0)
EOS ABS: 0.3 10*3/uL (ref 0.0–0.7)
Eosinophils Relative: 4.4 % (ref 0.0–5.0)
HEMATOCRIT: 40.8 % (ref 36.0–46.0)
HEMOGLOBIN: 13.6 g/dL (ref 12.0–15.0)
LYMPHS PCT: 20.4 % (ref 12.0–46.0)
Lymphs Abs: 1.3 10*3/uL (ref 0.7–4.0)
MCHC: 33.4 g/dL (ref 30.0–36.0)
MCV: 91.3 fl (ref 78.0–100.0)
MONO ABS: 0.7 10*3/uL (ref 0.1–1.0)
Monocytes Relative: 11.1 % (ref 3.0–12.0)
Neutro Abs: 4.1 10*3/uL (ref 1.4–7.7)
Neutrophils Relative %: 63 % (ref 43.0–77.0)
Platelets: 209 10*3/uL (ref 150.0–400.0)
RBC: 4.47 Mil/uL (ref 3.87–5.11)
RDW: 14.2 % (ref 11.5–15.5)
WBC: 6.4 10*3/uL (ref 4.0–10.5)

## 2016-09-15 LAB — TSH: TSH: 2.02 u[IU]/mL (ref 0.35–4.50)

## 2016-09-15 LAB — POC URINALSYSI DIPSTICK (AUTOMATED)
Bilirubin, UA: NEGATIVE
Glucose, UA: NEGATIVE
Ketones, UA: NEGATIVE
NITRITE UA: NEGATIVE
PH UA: 6 (ref 5.0–8.0)
Protein, UA: NEGATIVE
RBC UA: NEGATIVE
Spec Grav, UA: >=1.03 — AB (ref 1.010–1.025)
UROBILINOGEN UA: 0.2 U/dL

## 2016-09-15 MED ORDER — RAMIPRIL 5 MG PO CAPS: 5.0000 mg | ORAL_CAPSULE | Freq: Every day | ORAL | 3 refills | Status: DC

## 2016-09-15 NOTE — Patient Instructions (Signed)
WE NOW OFFER   [facility] FAST TRACK!!!  SAME DAY Appointments for ACUTE CARE  Such as: Sprains, Injuries, cuts, abrasions, rashes, muscle pain, joint pain, back pain Colds, flu, sore throats, headache, allergies, cough, fever  Ear pain, sinus and eye infections Abdominal pain, nausea, vomiting, diarrhea, upset stomach Animal/insect bites  3 Easy Ways to Schedule: Walk-In Scheduling Call in scheduling Mychart Sign-up: [email or web address]         

## 2016-09-15 NOTE — Progress Notes (Signed)
   Subjective:    Patient ID: Katherine Holland, female    DOB: 15-Dec-1940, 76 y.o.   MRN: [id number]  HPI Here to follow up on issues. She feels great and has no concerns. She was due for a follow up colonoscopy last month, but she has a lot of reservations about the safety of this at her age. She has never had a polyp of any kind. Her BP is stable at home. She sleeps well.    Review of Systems  Constitutional: Negative.   HENT: Negative.   Eyes: Negative.   Respiratory: Negative.   Cardiovascular: Negative.   Gastrointestinal: Negative.   Genitourinary: Negative for decreased urine volume, difficulty urinating, dyspareunia, dysuria, enuresis, flank pain, frequency, hematuria, pelvic pain and urgency.  Musculoskeletal: Negative.   Skin: Negative.   Neurological: Negative.   Psychiatric/Behavioral: Negative.        Objective:   Physical Exam  Constitutional: She is oriented to person, place, and time. She appears well-developed and well-nourished. No distress.  HENT:  Head: Normocephalic and atraumatic.  Right Ear: External ear normal.  Left Ear: External ear normal.  Nose: Nose normal.  Mouth/Throat: Oropharynx is clear and moist. No oropharyngeal exudate.  Eyes: Conjunctivae and EOM are normal. Pupils are equal, round, and reactive to light. No scleral icterus.  Neck: Normal range of motion. Neck supple. No JVD present. No thyromegaly present.  Cardiovascular: Normal rate, regular rhythm, normal heart sounds and intact distal pulses.  Exam reveals no gallop and no friction rub.   No murmur heard. Pulmonary/Chest: Effort normal and breath sounds normal. No respiratory distress. She has no wheezes. She has no rales. She exhibits no tenderness.  Abdominal: Soft. Bowel sounds are normal. She exhibits no distension and no mass. There is no tenderness. There is no rebound and no guarding.  Musculoskeletal: Normal range of motion. She exhibits no edema or tenderness.    Lymphadenopathy:    She has no cervical adenopathy.  Neurological: She is alert and oriented to person, place, and time. She has normal reflexes. No cranial nerve deficit. She exhibits normal muscle tone. Coordination normal.  Skin: Skin is warm and dry. No rash noted. No erythema.  Psychiatric: She has a normal mood and affect. Her behavior is normal. Judgment and thought content normal.          Assessment & Plan:  Her HTN is stable. Get fasting labs today. We discussed diet and exercise. We agreed to not get any more colonoscopies and she will do a Cologuard test instead.  Alysia Penna, MD

## 2016-09-16 ENCOUNTER — Telehealth: Payer: Self-pay | Admitting: Family Medicine

## 2016-09-16 NOTE — Telephone Encounter (Signed)
Pt requesting to order Cologuard, per Dr. Sarajane Jews okay to order.

## 2016-09-16 NOTE — Telephone Encounter (Signed)
Filled out for Dr. Sarajane Jews to sign.

## 2016-09-16 NOTE — Telephone Encounter (Signed)
Order form was signed and faxed today.  

## 2016-10-10 ENCOUNTER — Encounter: Payer: Self-pay | Admitting: Family Medicine

## 2016-10-10 DIAGNOSIS — Z1212 Encounter for screening for malignant neoplasm of rectum: Secondary | ICD-10-CM | POA: Diagnosis not present

## 2016-10-10 DIAGNOSIS — Z1211 Encounter for screening for malignant neoplasm of colon: Secondary | ICD-10-CM | POA: Diagnosis not present

## 2016-10-10 LAB — COLOGUARD

## 2016-11-01 ENCOUNTER — Encounter: Payer: Self-pay | Admitting: Family Medicine

## 2016-12-22 ENCOUNTER — Encounter: Payer: Self-pay | Admitting: Family Medicine

## 2017-01-19 ENCOUNTER — Ambulatory Visit (INDEPENDENT_AMBULATORY_CARE_PROVIDER_SITE_OTHER): Payer: Medicare Other | Admitting: *Deleted

## 2017-01-19 DIAGNOSIS — Z23 Encounter for immunization: Secondary | ICD-10-CM | POA: Diagnosis not present

## 2017-01-20 DIAGNOSIS — H25813 Combined forms of age-related cataract, bilateral: Secondary | ICD-10-CM | POA: Diagnosis not present

## 2017-01-20 DIAGNOSIS — H524 Presbyopia: Secondary | ICD-10-CM | POA: Diagnosis not present

## 2017-01-20 DIAGNOSIS — H04123 Dry eye syndrome of bilateral lacrimal glands: Secondary | ICD-10-CM | POA: Diagnosis not present

## 2017-01-20 DIAGNOSIS — H43812 Vitreous degeneration, left eye: Secondary | ICD-10-CM | POA: Diagnosis not present

## 2017-02-13 ENCOUNTER — Other Ambulatory Visit: Payer: Self-pay | Admitting: Family Medicine

## 2017-02-13 NOTE — Telephone Encounter (Signed)
Call in #90 with one rf 

## 2017-02-24 ENCOUNTER — Encounter: Payer: Self-pay | Admitting: Family Medicine

## 2017-02-24 ENCOUNTER — Ambulatory Visit (INDEPENDENT_AMBULATORY_CARE_PROVIDER_SITE_OTHER): Payer: Medicare Other | Admitting: Family Medicine

## 2017-02-24 VITALS — BP 120/82 | HR 84 | Temp 98.2°F | Ht 63.0 in | Wt 140.8 lb

## 2017-02-24 DIAGNOSIS — H6692 Otitis media, unspecified, left ear: Secondary | ICD-10-CM

## 2017-02-24 MED ORDER — AZITHROMYCIN 250 MG PO TABS: ORAL_TABLET | ORAL | 0 refills | Status: DC

## 2017-02-24 MED ORDER — GUAIFENESIN-CODEINE 100-10 MG/5ML PO SYRP: 10.0000 mL | ORAL_SOLUTION | Freq: Three times a day (TID) | ORAL | 0 refills | Status: DC | PRN

## 2017-02-24 NOTE — Progress Notes (Signed)
   Subjective:    Patient ID: Katherine Holland, female    DOB: 07/26/1940, 76 y.o.   MRN: [id number]  HPI URI- sxs started 'a few days ago' w/ PND.  sxs worsened on Wednesday.  Yesterday started to lose voice, cough deepened, sore glands, sore throat.  No fevers.  No sinus pain/pressure.  + L ear pressure.  No known sick contacts.  No N/V.  No HA.  Cough is productive.  Pt reports hx of this previously and 'the only thing that knocks this out of me is a Zpack'.   Review of Systems For ROS see HPI     Objective:   Physical Exam  Constitutional: She appears well-developed and well-nourished. No distress.  HENT:  Head: Normocephalic and atraumatic.  Right Ear: Tympanic membrane normal.  Left Ear: A middle ear effusion (dull TM) is present.  Nose: Mucosal edema and rhinorrhea present. Right sinus exhibits no maxillary sinus tenderness and no frontal sinus tenderness. Left sinus exhibits no maxillary sinus tenderness and no frontal sinus tenderness.  Mouth/Throat: Mucous membranes are normal. Posterior oropharyngeal erythema (w/ PND) present.  Eyes: Conjunctivae and EOM are normal. Pupils are equal, round, and reactive to light.  Neck: Normal range of motion. Neck supple.  Cardiovascular: Normal rate, regular rhythm and normal heart sounds.  Pulmonary/Chest: Effort normal and breath sounds normal. No respiratory distress. She has no wheezes. She has no rales.  Lymphadenopathy:    She has no cervical adenopathy.  Vitals reviewed.         Assessment & Plan:  OM- new.  Pt's sxs are most likely viral that has progressed to an early L OM.  She reports Zpack is only effective tx for her.  Start Zpack, cough syrup as needed for symptom relief.  Reviewed supportive care and red flags that should prompt return.  Pt expressed understanding and is in agreement w/ plan.

## 2017-02-24 NOTE — Patient Instructions (Signed)
Follow up as needed or as scheduled Start the Zpack as directed Use the cough syrup as needed- may cause drowsiness Drink plenty of fluids REST! Call with any questions or concerns Hang in there!!!

## 2017-03-22 DIAGNOSIS — L821 Other seborrheic keratosis: Secondary | ICD-10-CM | POA: Diagnosis not present

## 2017-03-22 DIAGNOSIS — L814 Other melanin hyperpigmentation: Secondary | ICD-10-CM | POA: Diagnosis not present

## 2017-03-22 DIAGNOSIS — L718 Other rosacea: Secondary | ICD-10-CM | POA: Diagnosis not present

## 2017-03-22 DIAGNOSIS — D2239 Melanocytic nevi of other parts of face: Secondary | ICD-10-CM | POA: Diagnosis not present

## 2017-05-11 ENCOUNTER — Telehealth: Payer: Self-pay | Admitting: Family Medicine

## 2017-05-11 NOTE — Telephone Encounter (Signed)
Copied from [place]. Topic: Quick Communication - See Telephone Encounter >> May 11, 2017  2:40 PM Aurelio Brash B wrote: CRM for notification. See Telephone encounter for:  PT states the pharmacy told her she needs to bring in a hard copy of the rx for zolpidem (AMBIEN) 10 MG tablet because it was originally called in to a pharmacy  out of town. Pt needs to know when she can pick it up, she is taking the last one tonight. 05/11/17.

## 2017-05-12 MED ORDER — ZOLPIDEM TARTRATE 10 MG PO TABS: ORAL_TABLET | ORAL | 1 refills | Status: DC

## 2017-05-12 NOTE — Telephone Encounter (Signed)
I printed the rx

## 2017-05-12 NOTE — Telephone Encounter (Signed)
Sent to PCP   Last refilled 02/13/2017 disp 90 with 1 refill sent to [facility]

## 2017-05-12 NOTE — Telephone Encounter (Signed)
Called pt and left a VM that written Rx has been placed up front for pick up.

## 2017-05-15 ENCOUNTER — Ambulatory Visit (INDEPENDENT_AMBULATORY_CARE_PROVIDER_SITE_OTHER): Payer: Medicare Other | Admitting: Family Medicine

## 2017-05-15 ENCOUNTER — Encounter: Payer: Self-pay | Admitting: Family Medicine

## 2017-05-15 VITALS — BP 122/74 | HR 94 | Temp 98.2°F | Wt 138.8 lb

## 2017-05-15 DIAGNOSIS — J209 Acute bronchitis, unspecified: Secondary | ICD-10-CM | POA: Diagnosis not present

## 2017-05-15 MED ORDER — HYDROCODONE-HOMATROPINE 5-1.5 MG/5ML PO SYRP: 5.0000 mL | ORAL_SOLUTION | ORAL | 0 refills | Status: DC | PRN

## 2017-05-15 MED ORDER — AMOXICILLIN-POT CLAVULANATE 875-125 MG PO TABS
1.0000 | ORAL_TABLET | Freq: Two times a day (BID) | ORAL | 0 refills | Status: DC
Start: 2017-05-15 — End: 2017-09-27

## 2017-05-15 NOTE — Progress Notes (Signed)
   Subjective:    Patient ID: Katherine Holland, female    DOB: Jan 14, 1941, 77 y.o.   MRN: [id number]  HPI Here for 3 weeks of stuffy head, PND, chest tightness and coughing up green sputum. No fever.    Review of Systems  Constitutional: Negative.   HENT: Positive for congestion, postnasal drip and sinus pressure. Negative for sinus pain and sore throat.   Eyes: Negative.   Respiratory: Positive for cough and chest tightness.        Objective:   Physical Exam  Constitutional: She appears well-developed and well-nourished.  HENT:  Right Ear: External ear normal.  Left Ear: External ear normal.  Nose: Nose normal.  Mouth/Throat: Oropharynx is clear and moist.  Eyes: Conjunctivae are normal.  Neck: No thyromegaly present.  Pulmonary/Chest: Effort normal. No respiratory distress. She has no wheezes. She has no rales.  Scattered rhonchi   Lymphadenopathy:    She has no cervical adenopathy.          Assessment & Plan:  Bronchitis, treat with Augmentin. Alysia Penna, MD

## 2017-09-26 ENCOUNTER — Other Ambulatory Visit: Payer: Self-pay | Admitting: Family Medicine

## 2017-09-26 DIAGNOSIS — Z1231 Encounter for screening mammogram for malignant neoplasm of breast: Secondary | ICD-10-CM

## 2017-09-27 ENCOUNTER — Encounter: Payer: Self-pay | Admitting: Family Medicine

## 2017-09-27 ENCOUNTER — Ambulatory Visit (INDEPENDENT_AMBULATORY_CARE_PROVIDER_SITE_OTHER): Payer: Medicare Other | Admitting: Family Medicine

## 2017-09-27 ENCOUNTER — Ambulatory Visit
Admission: RE | Admit: 2017-09-27 | Discharge: 2017-09-27 | Disposition: A | Discharge status: Home / Self Care | Payer: Medicare Other | Source: Ambulatory Visit | Attending: Family Medicine | Admitting: Family Medicine

## 2017-09-27 VITALS — BP 140/70 | HR 76 | Temp 98.0°F | Ht 62.0 in | Wt 142.6 lb

## 2017-09-27 DIAGNOSIS — D508 Other iron deficiency anemias: Secondary | ICD-10-CM | POA: Diagnosis not present

## 2017-09-27 DIAGNOSIS — Z1231 Encounter for screening mammogram for malignant neoplasm of breast: Secondary | ICD-10-CM | POA: Diagnosis not present

## 2017-09-27 DIAGNOSIS — E039 Hypothyroidism, unspecified: Secondary | ICD-10-CM

## 2017-09-27 DIAGNOSIS — I10 Essential (primary) hypertension: Secondary | ICD-10-CM | POA: Diagnosis not present

## 2017-09-27 DIAGNOSIS — E782 Mixed hyperlipidemia: Secondary | ICD-10-CM

## 2017-09-27 DIAGNOSIS — I499 Cardiac arrhythmia, unspecified: Secondary | ICD-10-CM

## 2017-09-27 DIAGNOSIS — G4700 Insomnia, unspecified: Secondary | ICD-10-CM

## 2017-09-27 LAB — CBC WITH DIFFERENTIAL/PLATELET
BASOS PCT: 1.2 % (ref 0.0–3.0)
Basophils Absolute: 0.1 10*3/uL (ref 0.0–0.1)
EOS ABS: 0.3 10*3/uL (ref 0.0–0.7)
EOS PCT: 4.4 % (ref 0.0–5.0)
HCT: 43 % (ref 36.0–46.0)
Hemoglobin: 14.1 g/dL (ref 12.0–15.0)
LYMPHS ABS: 1.6 10*3/uL (ref 0.7–4.0)
Lymphocytes Relative: 27.4 % (ref 12.0–46.0)
MCHC: 32.8 g/dL (ref 30.0–36.0)
MCV: 89.6 fl (ref 78.0–100.0)
MONO ABS: 0.7 10*3/uL (ref 0.1–1.0)
Monocytes Relative: 12.9 % — ABNORMAL HIGH (ref 3.0–12.0)
NEUTROS PCT: 54.1 % (ref 43.0–77.0)
Neutro Abs: 3.1 10*3/uL (ref 1.4–7.7)
Platelets: 215 10*3/uL (ref 150.0–400.0)
RBC: 4.8 Mil/uL (ref 3.87–5.11)
RDW: 15.2 % (ref 11.5–15.5)
WBC: 5.7 10*3/uL (ref 4.0–10.5)

## 2017-09-27 LAB — BASIC METABOLIC PANEL
BUN: 17 mg/dL (ref 6–23)
CHLORIDE: 101 meq/L (ref 96–112)
CO2: 28 mEq/L (ref 19–32)
Calcium: 9.8 mg/dL (ref 8.4–10.5)
Creatinine, Ser: 0.81 mg/dL (ref 0.40–1.20)
GFR: 73.01 mL/min (ref >60.00)
Glucose, Bld: 95 mg/dL (ref 70–99)
Potassium: 4.7 mEq/L (ref 3.5–5.1)
Sodium: 136 mEq/L (ref 135–145)

## 2017-09-27 LAB — HEPATIC FUNCTION PANEL
ALT: 15 U/L (ref 0–35)
AST: 18 U/L (ref 0–37)
Albumin: 4.3 g/dL (ref 3.5–5.2)
Alkaline Phosphatase: 64 U/L (ref 39–117)
BILIRUBIN DIRECT: 0.1 mg/dL (ref 0.0–0.3)
BILIRUBIN TOTAL: 0.7 mg/dL (ref 0.2–1.2)
Total Protein: 7 g/dL (ref 6.0–8.3)

## 2017-09-27 LAB — LIPID PANEL
CHOLESTEROL: 256 mg/dL — AB (ref 0–200)
HDL: 85.8 mg/dL (ref >39.00)
LDL Cholesterol: 152 mg/dL — ABNORMAL HIGH (ref 0–99)
NonHDL: 170.29
TRIGLYCERIDES: 89 mg/dL (ref 0.0–149.0)
Total CHOL/HDL Ratio: 3
VLDL: 17.8 mg/dL (ref 0.0–40.0)

## 2017-09-27 LAB — TSH: TSH: 2.29 u[IU]/mL (ref 0.35–4.50)

## 2017-09-27 MED ORDER — RAMIPRIL 5 MG PO CAPS: 5.0000 mg | ORAL_CAPSULE | Freq: Every day | ORAL | 3 refills | Status: DC

## 2017-09-27 NOTE — Progress Notes (Signed)
   Subjective:    Patient ID: Katherine Holland, female    DOB: 1940-05-17, 77 y.o.   MRN: [id number]  HPI Here to follow up on issues. She feels great. Her BP at home has been stable in the 120s over 70s. She sleeps well with Zolpidem. She has decided to forego any more colonoscopies so she had her first Cologuard test last year (it was negative).    Review of Systems  Constitutional: Negative.   HENT: Negative.   Eyes: Negative.   Respiratory: Negative.   Cardiovascular: Negative.   Gastrointestinal: Negative.   Genitourinary: Negative.  Negative for decreased urine volume, difficulty urinating, dyspareunia, dysuria, enuresis, flank pain, frequency, hematuria, pelvic pain and urgency.  Musculoskeletal: Negative.   Skin: Negative.   Neurological: Negative.   Psychiatric/Behavioral: Negative.        Objective:   Physical Exam  Constitutional: She is oriented to person, place, and time. She appears well-developed and well-nourished. No distress.  HENT:  Head: Normocephalic and atraumatic.  Right Ear: External ear normal.  Left Ear: External ear normal.  Nose: Nose normal.  Mouth/Throat: Oropharynx is clear and moist. No oropharyngeal exudate.  Eyes: Pupils are equal, round, and reactive to light. Conjunctivae and EOM are normal. No scleral icterus.  Neck: Normal range of motion. Neck supple. No JVD present. No thyromegaly present.  Cardiovascular: Normal rate, normal heart sounds and intact distal pulses. Exam reveals no gallop and no friction rub.  No murmur heard. Irregular rhythm. EKG shows ventricular trigeminy   Pulmonary/Chest: Effort normal and breath sounds normal. No respiratory distress. She has no wheezes. She has no rales. She exhibits no tenderness.  Abdominal: Soft. Bowel sounds are normal. She exhibits no distension and no mass. There is no tenderness. There is no rebound and no guarding.  Musculoskeletal: Normal range of motion. She exhibits no edema or  tenderness.  Lymphadenopathy:    She has no cervical adenopathy.  Neurological: She is alert and oriented to person, place, and time. She has normal reflexes. She displays normal reflexes. No cranial nerve deficit. She exhibits normal muscle tone. Coordination normal.  Skin: Skin is warm and dry. No rash noted. No erythema.  Psychiatric: She has a normal mood and affect. Her behavior is normal. Judgment and thought content normal.          Assessment & Plan:  Her HTN is stable. Insomnia is stable. Get fasting labs to check lipids, Hgb, and TSH etc. Refer to Cardiology for the trigemeny.  Alysia Penna, MD

## 2017-10-02 ENCOUNTER — Encounter: Payer: Self-pay | Admitting: Family Medicine

## 2017-11-07 ENCOUNTER — Other Ambulatory Visit: Payer: Self-pay | Admitting: Family Medicine

## 2017-11-07 NOTE — Telephone Encounter (Signed)
Last fill 05/12/17 [facility] 09/27/17  Ok to fill?

## 2017-11-08 ENCOUNTER — Ambulatory Visit: Payer: Medicare Other | Admitting: Cardiology

## 2017-11-08 ENCOUNTER — Encounter: Payer: Self-pay | Admitting: Cardiology

## 2017-11-08 VITALS — BP 118/62 | HR 69 | Ht 62.5 in | Wt 143.2 lb

## 2017-11-08 DIAGNOSIS — I493 Ventricular premature depolarization: Secondary | ICD-10-CM

## 2017-11-08 DIAGNOSIS — R9431 Abnormal electrocardiogram [ECG] [EKG]: Secondary | ICD-10-CM | POA: Diagnosis not present

## 2017-11-08 DIAGNOSIS — Z7189 Other specified counseling: Secondary | ICD-10-CM | POA: Diagnosis not present

## 2017-11-08 DIAGNOSIS — I10 Essential (primary) hypertension: Secondary | ICD-10-CM

## 2017-11-08 NOTE — Progress Notes (Signed)
Cardiology Office Note:    Date:  11/08/2017   ID:  Katherine Holland, DOB 1940/12/15, MRN [id number]  PCP:  Laurey Morale, MD  Cardiologist:  Buford Dresser, MD PhD  Referring MD: Laurey Morale, MD   Chief Complaint  Patient presents with  . New Patient (Initial Visit)    having irregular heart beat, went to PCP, denies chest pains, SOB, swelling in hands/feet    History of Present Illness:    Katherine Holland is a 77 y.o. female with a hx of hypertension, hypothyroidism, hyperlipidemia who is seen as a new consult at the request of Dr. Sarajane Jews for evaluation and management of irregular heart beat. She was noted to be in ventricular trigeminy at her recent office visit.  Could not feel that she was in trigeminy at the PCP visit. She doesn't drink caffeine because she feels her heart beat faster. Doesn't think it beats harder. Has been that way for many years. Her siblings have the same issue. No other triggers. No chest pain, shortness of breath. No lightheadedness, syncope. Never had prior heart issues, never had any cardiac testing.  Walks daily, 1-1.25 miles. No symptoms. BP at home runs <130/80. No diabetes. Never smoker. EtOH: 1-2 glasses most nights. Diet: Varied. Likes to [employer]. Eats everything in moderation.  FH: Father lived to 68. No history of heart problems.  Past Medical History:  Diagnosis Date  . Abdominal bloating   . Anemia   . Arthritis   . Constipation   . Gynecological examination    sees Dr. Sharrie Rothman  . Hyperlipidemia   . Hypertension   . Hypothyroidism   . Insomnia   . Osteopenia    last DEXA on 06-06-07 was normal   . Plantar fasciitis, bilateral   . Post-menopausal   . Urinary frequency     Past Surgical History:  Procedure Laterality Date  . APPENDECTOMY    . COLONOSCOPY  08/17/2006   per Dr. Velora Heckler, diverticulosis, no repeats, elected to do Cologuard instead (2018-negative)   . HERNIA REPAIR      Current Medications: Current Outpatient  Medications on File Prior to Visit  Medication Sig  . aspirin 81 MG tablet Take 81 mg by mouth daily.  . ramipril (ALTACE) 5 MG capsule Take 1 capsule (5 mg total) by mouth daily.  . vitamin E 600 UNIT capsule Take 600 Units by mouth daily.  Marland Kitchen zolpidem (AMBIEN) 10 MG tablet TAKE 1 TABLET BY MOUTH IN THE EVENING AS NEEDED   No current facility-administered medications on file prior to visit.      Allergies:   Patient has no known allergies.   Social History   Socioeconomic History  . Marital status: Married    Spouse name: Not on file  . Number of children: Not on file  . Years of education: Not on file  . Highest education level: Not on file  Occupational History  . Not on file  Social Needs  . Financial resource strain: Not on file  . Food insecurity:    Worry: Not on file    Inability: Not on file  . Transportation needs:    Medical: Not on file    Non-medical: Not on file  Tobacco Use  . Smoking status: Never Smoker  . Smokeless tobacco: Never Used  Substance and Sexual Activity  . Alcohol use: Yes    Alcohol/week: 0.0 oz    Comment: glass of wine each night  . Drug use: No  .  Sexual activity: Not on file  Lifestyle  . Physical activity:    Days per week: Not on file    Minutes per session: Not on file  . Stress: Not on file  Relationships  . Social connections:    Talks on phone: Not on file    Gets together: Not on file    Attends religious service: Not on file    Active member of club or organization: Not on file    Attends meetings of clubs or organizations: Not on file    Relationship status: Not on file  Other Topics Concern  . Not on file  Social History Narrative  . Not on file     Family History: The patient's family history is negative for Breast cancer. No heart disease  ROS:   Please see the history of present illness.  Additional pertinent ROS: Review of Systems  Constitutional: Negative for chills, diaphoresis, fever, malaise/fatigue  and weight loss.  HENT: Negative for ear pain and hearing loss.   Eyes: Negative for blurred vision and pain.  Respiratory: Negative for cough and shortness of breath.   Cardiovascular: Negative for chest pain, palpitations, orthopnea, claudication, leg swelling and PND.  Gastrointestinal: Negative for abdominal pain, blood in stool and melena.  Genitourinary: Negative for dysuria and hematuria.  Musculoskeletal: Negative for joint pain and myalgias.  Skin: Negative for rash.  Neurological: Negative for focal weakness and loss of consciousness.  Endo/Heme/Allergies: Bruises/bleeds easily.   EKGs/Labs/Other Studies Reviewed:    The following studies were reviewed today: ECG from PCP visit showing ventricular trigeminy. Flattened T waves In 2017, limb lead low voltage, but otherwise normal In 2016, limb lead low voltage, PVC  EKG:  EKG is ordered today.  The ekg ordered today demonstrates normal sinus rhythm with a PVC. New diffuse t wave inversions. Borderline QTc prolongation.  Recent Labs: 09/27/2017: ALT 15; BUN 17; Creatinine, Ser 0.81; Hemoglobin 14.1; Platelets 215.0; Potassium 4.7; Sodium 136; TSH 2.29  Recent Lipid Panel    Component Value Date/Time   CHOL 256 (H) 09/27/2017 1447   TRIG 89.0 09/27/2017 1447   HDL 85.80 09/27/2017 1447   CHOLHDL 3 09/27/2017 1447   VLDL 17.8 09/27/2017 1447   LDLCALC 152 (H) 09/27/2017 1447   LDLDIRECT 114.9 08/07/2012 1052    Physical Exam:    VS:  BP 118/62 (BP Location: Left Arm, Patient Position: Sitting)   Pulse 69   Ht 5' 2.5" (1.588 m)   Wt 143 lb 3.2 oz (65 kg)   BMI 25.77 kg/m     Wt Readings from Last 3 Encounters:  11/08/17 143 lb 3.2 oz (65 kg)  09/27/17 142 lb 9.6 oz (64.7 kg)  05/15/17 138 lb 12.8 oz (63 kg)    GEN: Well nourished, well developed in no acute distress HEENT: Normal NECK: No JVD; No carotid bruits LYMPHATICS: No lymphadenopathy CARDIAC: regular rhythm, normal S1 and S2, no murmurs, rubs, gallops.  Radial and DP pulses 2+ bilaterally. RESPIRATORY:  Clear to auscultation without rales, wheezing or rhonchi  ABDOMEN: Soft, non-tender, non-distended MUSCULOSKELETAL:  No edema; No deformity  SKIN: Warm and dry NEUROLOGIC:  Alert and oriented x 3 PSYCHIATRIC:  Normal affect   ASSESSMENT:    1. PVC (premature ventricular contraction)   2. Essential hypertension   3. Counseling on health promotion and disease prevention   4. T wave inversion in EKG    PLAN:    1. PVCs, abnormal ECG: had ventricular trigeminy recently, today with  PVC and diffuse TWI. Asymptomatic and feels well -echocardiogram to look for structural abnormalities -holter monitor to evaluate PVC burden -based on results, will determine need for stress test or cath.  2. Prevention: counseled on risk factor management: -Hypertension: typically well controlled -no tobacco -excellent lifestyle with diet and exercise -lipids: HDL 85, LDL 114  Currently taking aspirin, not taking statin. Discussed current guidelines and recommendations. She is in excellent health overall and has a good life expectancy, so she should derive benefit from a statin. However, given her age and overall risk factors, this is a Scientist, research (physical sciences). She will continue to think about it, and the results of her cardiac testing will also help guide management. The 10-year ASCVD risk score Mikey Bussing DC Brooke Bonito., et al., 2013) is: 20.8%   Values used to calculate the score:     Age: 38 years     Sex: Female     Is Non-Hispanic African American: No     Diabetic: No     Tobacco smoker: No     Systolic Blood Pressure: 707 mmHg     Is BP treated: Yes     HDL Cholesterol: 85.8 mg/dL     Total Cholesterol: 256 mg/dL  Plan for follow up: She wishes to follow up as needed based on the results of her testing.  Medication Adjustments/Labs and Tests Ordered: Current medicines are reviewed at length with the patient today.  Concerns regarding medicines are outlined  above.  Orders Placed This Encounter  Procedures  . HOLTER MONITOR - 48 HOUR  . EKG 12-Lead  . ECHOCARDIOGRAM COMPLETE   No orders of the defined types were placed in this encounter.   Patient Instructions  Medication Instructions: Your physician recommends that you continue on your current medications as directed.    If you need a refill on your cardiac medications before your next appointment, please call your pharmacy.   Labwork: None  Procedures/Testing: Your physician has recommended that you wear a 48 hour holter monitor. Holter monitors are medical devices that record the heart's electrical activity. Doctors most often use these monitors to diagnose arrhythmias. Arrhythmias are problems with the speed or rhythm of the heartbeat. The monitor is a small, portable device. You can wear one while you do your normal daily activities. This is usually used to diagnose what is causing palpitations/syncope (passing out). [place] has requested that you have an echocardiogram. Echocardiography is a painless test that uses sound waves to create images of your heart. It provides your doctor with information about the size and shape of your heart and how well your heart's chambers and valves are working. This procedure takes approximately one hour. There are no restrictions for this procedure. [place] [address]    Follow-Up: Your physician wants you to follow-up as needed with Dr. Harrell Gave. Call our office at [phone] to schedule appointment.   Special Instructions:    Thank you for choosing Heartcare at [facility]!!       Signed, Buford Dresser, MD PhD 11/08/2017 11:28 AM    [place]

## 2017-11-08 NOTE — Patient Instructions (Signed)
Medication Instructions: Your physician recommends that you continue on your current medications as directed.    If you need a refill on your cardiac medications before your next appointment, please call your pharmacy.   Labwork: None  Procedures/Testing: Your physician has recommended that you wear a 48 hour holter monitor. Holter monitors are medical devices that record the heart's electrical activity. Doctors most often use these monitors to diagnose arrhythmias. Arrhythmias are problems with the speed or rhythm of the heartbeat. The monitor is a small, portable device. You can wear one while you do your normal daily activities. This is usually used to diagnose what is causing palpitations/syncope (passing out). [place] has requested that you have an echocardiogram. Echocardiography is a painless test that uses sound waves to create images of your heart. It provides your doctor with information about the size and shape of your heart and how well your heart's chambers and valves are working. This procedure takes approximately one hour. There are no restrictions for this procedure. [place] [address]    Follow-Up: Your physician wants you to follow-up as needed with Dr. Harrell Gave. Call our office at [phone] to schedule appointment.   Special Instructions:    Thank you for choosing Heartcare at [facility]!!

## 2017-11-23 ENCOUNTER — Ambulatory Visit (HOSPITAL_COMMUNITY): Payer: Medicare Other | Attending: Cardiovascular Disease

## 2017-11-23 ENCOUNTER — Ambulatory Visit (INDEPENDENT_AMBULATORY_CARE_PROVIDER_SITE_OTHER): Payer: Medicare Other

## 2017-11-23 ENCOUNTER — Other Ambulatory Visit: Payer: Self-pay

## 2017-11-23 DIAGNOSIS — I493 Ventricular premature depolarization: Secondary | ICD-10-CM | POA: Diagnosis not present

## 2017-11-23 DIAGNOSIS — D649 Anemia, unspecified: Secondary | ICD-10-CM | POA: Diagnosis not present

## 2017-11-23 DIAGNOSIS — I10 Essential (primary) hypertension: Secondary | ICD-10-CM | POA: Diagnosis not present

## 2017-11-23 DIAGNOSIS — E785 Hyperlipidemia, unspecified: Secondary | ICD-10-CM | POA: Insufficient documentation

## 2017-11-23 DIAGNOSIS — I517 Cardiomegaly: Secondary | ICD-10-CM

## 2017-12-11 ENCOUNTER — Telehealth: Payer: Self-pay

## 2017-12-11 ENCOUNTER — Other Ambulatory Visit: Payer: Self-pay

## 2017-12-11 DIAGNOSIS — I493 Ventricular premature depolarization: Secondary | ICD-10-CM

## 2017-12-11 DIAGNOSIS — I498 Other specified cardiac arrhythmias: Secondary | ICD-10-CM

## 2017-12-11 NOTE — Telephone Encounter (Signed)
Pt updated with monitor results along with Dr. Judeth Cornfield recommendation. Pt verbalized understanding. Orders placed.

## 2017-12-12 ENCOUNTER — Encounter: Payer: Self-pay | Admitting: Nurse Practitioner

## 2017-12-15 ENCOUNTER — Telehealth (HOSPITAL_COMMUNITY): Payer: Self-pay

## 2017-12-15 NOTE — Telephone Encounter (Signed)
Encounter complete. 

## 2017-12-20 ENCOUNTER — Ambulatory Visit (HOSPITAL_COMMUNITY)
Admission: RE | Admit: 2017-12-20 | Discharge: 2017-12-20 | Disposition: A | Discharge status: Home / Self Care | Payer: Medicare Other | Source: Ambulatory Visit | Attending: Cardiovascular Disease | Admitting: Cardiovascular Disease

## 2017-12-20 DIAGNOSIS — I493 Ventricular premature depolarization: Secondary | ICD-10-CM | POA: Insufficient documentation

## 2017-12-20 DIAGNOSIS — H04123 Dry eye syndrome of bilateral lacrimal glands: Secondary | ICD-10-CM | POA: Diagnosis not present

## 2017-12-20 DIAGNOSIS — H43812 Vitreous degeneration, left eye: Secondary | ICD-10-CM | POA: Diagnosis not present

## 2017-12-20 DIAGNOSIS — I498 Other specified cardiac arrhythmias: Secondary | ICD-10-CM | POA: Insufficient documentation

## 2017-12-20 DIAGNOSIS — H25813 Combined forms of age-related cataract, bilateral: Secondary | ICD-10-CM | POA: Diagnosis not present

## 2017-12-20 DIAGNOSIS — H52203 Unspecified astigmatism, bilateral: Secondary | ICD-10-CM | POA: Diagnosis not present

## 2017-12-20 LAB — MYOCARDIAL PERFUSION IMAGING
CHL CUP MPHR: 144 {beats}/min
CHL CUP NUCLEAR SSS: 7
CHL CUP RESTING HR STRESS: 61 {beats}/min
CHL RATE OF PERCEIVED EXERTION: 19
CSEPED: 6 min
CSEPEDS: 0 s
CSEPEW: 7 METS
LV dias vol: 94 mL (ref 46–106)
LV sys vol: 42 mL
Peak HR: 137 {beats}/min
Percent HR: 95 %
SDS: 6
SRS: 1
TID: 0.98

## 2017-12-20 MED ORDER — TECHNETIUM TC 99M TETROFOSMIN IV KIT
25.4000 | PACK | Freq: Once | INTRAVENOUS | Status: AC | PRN
  Administered 2017-12-20: 25.4 via INTRAVENOUS
  Filled 2017-12-20: qty 26

## 2017-12-20 MED ORDER — TECHNETIUM TC 99M TETROFOSMIN IV KIT
8.0000 | PACK | Freq: Once | INTRAVENOUS | Status: AC | PRN
  Administered 2017-12-20: 8 via INTRAVENOUS
  Filled 2017-12-20: qty 8

## 2017-12-22 ENCOUNTER — Telehealth: Payer: Self-pay | Admitting: Cardiology

## 2017-12-22 NOTE — Telephone Encounter (Signed)
Pt updated with lab result. Verbalized understanding with no further questions.

## 2017-12-22 NOTE — Telephone Encounter (Signed)
New Message   Pt is returning call for nurse about stress test results. Please call

## 2017-12-22 NOTE — Telephone Encounter (Signed)
Returned call to pt and left message stating I seen she had already reviewed ECHO results on mychart and to call back if she has any additional questions.

## 2018-01-12 DIAGNOSIS — Z124 Encounter for screening for malignant neoplasm of cervix: Secondary | ICD-10-CM | POA: Diagnosis not present

## 2018-01-17 ENCOUNTER — Ambulatory Visit (INDEPENDENT_AMBULATORY_CARE_PROVIDER_SITE_OTHER): Payer: Medicare Other | Admitting: *Deleted

## 2018-01-17 DIAGNOSIS — Z23 Encounter for immunization: Secondary | ICD-10-CM | POA: Diagnosis not present

## 2018-04-13 ENCOUNTER — Other Ambulatory Visit: Payer: Self-pay | Admitting: Family Medicine

## 2018-04-13 DIAGNOSIS — Z1231 Encounter for screening mammogram for malignant neoplasm of breast: Secondary | ICD-10-CM

## 2018-05-07 ENCOUNTER — Encounter: Payer: Self-pay | Admitting: Family Medicine

## 2018-05-07 NOTE — Telephone Encounter (Signed)
Dr. Sarajane Jews please advise on refill of the zolpidem.  thanks

## 2018-05-09 DIAGNOSIS — D2271 Melanocytic nevi of right lower limb, including hip: Secondary | ICD-10-CM | POA: Diagnosis not present

## 2018-05-09 DIAGNOSIS — D2239 Melanocytic nevi of other parts of face: Secondary | ICD-10-CM | POA: Diagnosis not present

## 2018-05-09 DIAGNOSIS — D2261 Melanocytic nevi of right upper limb, including shoulder: Secondary | ICD-10-CM | POA: Diagnosis not present

## 2018-05-09 DIAGNOSIS — D225 Melanocytic nevi of trunk: Secondary | ICD-10-CM | POA: Diagnosis not present

## 2018-05-09 DIAGNOSIS — L720 Epidermal cyst: Secondary | ICD-10-CM | POA: Diagnosis not present

## 2018-05-09 MED ORDER — ZOLPIDEM TARTRATE 10 MG PO TABS: 10.0000 mg | ORAL_TABLET | Freq: Every day | ORAL | 1 refills | Status: DC

## 2018-05-09 NOTE — Telephone Encounter (Signed)
Ready to pick up.  

## 2018-10-02 ENCOUNTER — Ambulatory Visit: Payer: Medicare Other

## 2018-10-03 ENCOUNTER — Encounter: Payer: Medicare Other | Admitting: Family Medicine

## 2018-10-09 ENCOUNTER — Encounter: Payer: Self-pay | Admitting: Family Medicine

## 2018-10-09 ENCOUNTER — Ambulatory Visit (INDEPENDENT_AMBULATORY_CARE_PROVIDER_SITE_OTHER): Payer: Medicare Other | Admitting: Family Medicine

## 2018-10-09 VITALS — BP 124/80 | HR 72 | Temp 98.9°F | Ht 63.0 in | Wt 145.2 lb

## 2018-10-09 DIAGNOSIS — Z0000 Encounter for general adult medical examination without abnormal findings: Secondary | ICD-10-CM | POA: Diagnosis not present

## 2018-10-09 DIAGNOSIS — M810 Age-related osteoporosis without current pathological fracture: Secondary | ICD-10-CM

## 2018-10-09 DIAGNOSIS — Z0289 Encounter for other administrative examinations: Secondary | ICD-10-CM

## 2018-10-09 LAB — CBC WITH DIFFERENTIAL/PLATELET
Basophils Absolute: 0 10*3/uL (ref 0.0–0.1)
Basophils Relative: 1 % (ref 0.0–3.0)
Eosinophils Absolute: 0.3 10*3/uL (ref 0.0–0.7)
Eosinophils Relative: 6.6 % — ABNORMAL HIGH (ref 0.0–5.0)
HCT: 42 % (ref 36.0–46.0)
Hemoglobin: 14 g/dL (ref 12.0–15.0)
Lymphocytes Relative: 27.2 % (ref 12.0–46.0)
Lymphs Abs: 1.4 10*3/uL (ref 0.7–4.0)
MCHC: 33.2 g/dL (ref 30.0–36.0)
MCV: 92.3 fl (ref 78.0–100.0)
Monocytes Absolute: 0.5 10*3/uL (ref 0.1–1.0)
Monocytes Relative: 10.3 % (ref 3.0–12.0)
Neutro Abs: 2.8 10*3/uL (ref 1.4–7.7)
Neutrophils Relative %: 54.9 % (ref 43.0–77.0)
Platelets: 216 10*3/uL (ref 150.0–400.0)
RBC: 4.55 Mil/uL (ref 3.87–5.11)
RDW: 14.8 % (ref 11.5–15.5)
WBC: 5.1 10*3/uL (ref 4.0–10.5)

## 2018-10-09 LAB — BASIC METABOLIC PANEL
BUN: 20 mg/dL (ref 6–23)
CO2: 27 mEq/L (ref 19–32)
Calcium: 9.4 mg/dL (ref 8.4–10.5)
Chloride: 104 mEq/L (ref 96–112)
Creatinine, Ser: 0.86 mg/dL (ref 0.40–1.20)
GFR: 63.93 mL/min (ref >60.00)
Glucose, Bld: 82 mg/dL (ref 70–99)
Potassium: 4.8 mEq/L (ref 3.5–5.1)
Sodium: 140 mEq/L (ref 135–145)

## 2018-10-09 LAB — POC URINALSYSI DIPSTICK (AUTOMATED)
Bilirubin, UA: NEGATIVE
Blood, UA: NEGATIVE
Glucose, UA: NEGATIVE
Ketones, UA: NEGATIVE
Leukocytes, UA: NEGATIVE
Nitrite, UA: NEGATIVE
Protein, UA: NEGATIVE
Spec Grav, UA: 1.02 (ref 1.010–1.025)
Urobilinogen, UA: 0.2 E.U./dL
pH, UA: 6.5 (ref 5.0–8.0)

## 2018-10-09 LAB — HEPATIC FUNCTION PANEL
ALT: 14 U/L (ref 0–35)
AST: 16 U/L (ref 0–37)
Albumin: 4.3 g/dL (ref 3.5–5.2)
Alkaline Phosphatase: 63 U/L (ref 39–117)
Bilirubin, Direct: 0.1 mg/dL (ref 0.0–0.3)
Total Bilirubin: 0.7 mg/dL (ref 0.2–1.2)
Total Protein: 6.9 g/dL (ref 6.0–8.3)

## 2018-10-09 LAB — LIPID PANEL
Cholesterol: 231 mg/dL — ABNORMAL HIGH (ref 0–200)
HDL: 79.7 mg/dL (ref >39.00)
LDL Cholesterol: 136 mg/dL — ABNORMAL HIGH (ref 0–99)
NonHDL: 151.64
Total CHOL/HDL Ratio: 3
Triglycerides: 77 mg/dL (ref 0.0–149.0)
VLDL: 15.4 mg/dL (ref 0.0–40.0)

## 2018-10-09 LAB — TSH: TSH: 1.98 u[IU]/mL (ref 0.35–4.50)

## 2018-10-09 LAB — VITAMIN D 25 HYDROXY (VIT D DEFICIENCY, FRACTURES): VITD: 27.74 ng/mL — ABNORMAL LOW (ref 30.00–100.00)

## 2018-10-09 MED ORDER — RAMIPRIL 5 MG PO CAPS: 5.0000 mg | ORAL_CAPSULE | Freq: Every day | ORAL | 3 refills | Status: DC

## 2018-10-09 MED ORDER — ZOLPIDEM TARTRATE 10 MG PO TABS: 10.0000 mg | ORAL_TABLET | Freq: Every day | ORAL | 1 refills | Status: DC

## 2018-10-09 NOTE — Progress Notes (Signed)
   Subjective:    Patient ID: Katherine Holland, female    DOB: 06-05-1940, 78 y.o.   MRN: [id number]  HPI Here for a well exam. She feels well and her BP has been stable.    Review of Systems  Constitutional: Negative.   HENT: Negative.   Eyes: Negative.   Respiratory: Negative.   Cardiovascular: Negative.   Gastrointestinal: Negative.   Genitourinary: Negative for decreased urine volume, difficulty urinating, dyspareunia, dysuria, enuresis, flank pain, frequency, hematuria, pelvic pain and urgency.  Musculoskeletal: Negative.   Skin: Negative.   Neurological: Negative.   Psychiatric/Behavioral: Negative.        Objective:   Physical Exam Constitutional:      General: She is not in acute distress.    Appearance: She is well-developed.  HENT:     Head: Normocephalic and atraumatic.     Right Ear: External ear normal.     Left Ear: External ear normal.     Nose: Nose normal.     Mouth/Throat:     Pharynx: No oropharyngeal exudate.  Eyes:     General: No scleral icterus.    Conjunctiva/sclera: Conjunctivae normal.     Pupils: Pupils are equal, round, and reactive to light.  Neck:     Musculoskeletal: Normal range of motion and neck supple.     Thyroid: No thyromegaly.     Vascular: No JVD.  Cardiovascular:     Rate and Rhythm: Normal rate and regular rhythm.     Heart sounds: Normal heart sounds. No murmur. No friction rub. No gallop.   Pulmonary:     Effort: Pulmonary effort is normal. No respiratory distress.     Breath sounds: Normal breath sounds. No wheezing or rales.  Chest:     Chest wall: No tenderness.  Abdominal:     General: Bowel sounds are normal. There is no distension.     Palpations: Abdomen is soft. There is no mass.     Tenderness: There is no abdominal tenderness. There is no guarding or rebound.  Musculoskeletal: Normal range of motion.        General: No tenderness.  Lymphadenopathy:     Cervical: No cervical adenopathy.  Skin:    General:  Skin is warm and dry.     Findings: No erythema or rash.  Neurological:     Mental Status: She is alert and oriented to person, place, and time.     Cranial Nerves: No cranial nerve deficit.     Motor: No abnormal muscle tone.     Coordination: Coordination normal.     Deep Tendon Reflexes: Reflexes are normal and symmetric. Reflexes normal.  Psychiatric:        Behavior: Behavior normal.        Thought Content: Thought content normal.        Judgment: Judgment normal.           Assessment & Plan:  Well exam. We discussed diet and exercise. Get fasting labs.  Alysia Penna, MD

## 2018-10-10 ENCOUNTER — Encounter: Payer: Self-pay | Admitting: *Deleted

## 2018-10-10 NOTE — Telephone Encounter (Signed)
Dr. Sarajane Jews please advise on order of bone density.  Thanks

## 2018-10-11 NOTE — Telephone Encounter (Signed)
The order was placed.

## 2018-11-21 ENCOUNTER — Other Ambulatory Visit: Payer: Self-pay

## 2018-11-21 ENCOUNTER — Ambulatory Visit
Admission: RE | Admit: 2018-11-21 | Discharge: 2018-11-21 | Disposition: A | Discharge status: Home / Self Care | Payer: Medicare Other | Source: Ambulatory Visit | Attending: Family Medicine | Admitting: Family Medicine

## 2018-11-21 DIAGNOSIS — Z1231 Encounter for screening mammogram for malignant neoplasm of breast: Secondary | ICD-10-CM

## 2018-12-27 ENCOUNTER — Ambulatory Visit
Admission: RE | Admit: 2018-12-27 | Discharge: 2018-12-27 | Disposition: A | Discharge status: Home / Self Care | Payer: Medicare Other | Source: Ambulatory Visit | Attending: Family Medicine | Admitting: Family Medicine

## 2018-12-27 ENCOUNTER — Other Ambulatory Visit: Payer: Self-pay

## 2018-12-27 DIAGNOSIS — Z780 Asymptomatic menopausal state: Secondary | ICD-10-CM | POA: Diagnosis not present

## 2018-12-27 DIAGNOSIS — M810 Age-related osteoporosis without current pathological fracture: Secondary | ICD-10-CM

## 2018-12-27 DIAGNOSIS — M85852 Other specified disorders of bone density and structure, left thigh: Secondary | ICD-10-CM | POA: Diagnosis not present

## 2019-01-09 ENCOUNTER — Other Ambulatory Visit: Payer: Self-pay

## 2019-01-09 ENCOUNTER — Ambulatory Visit (INDEPENDENT_AMBULATORY_CARE_PROVIDER_SITE_OTHER): Payer: Medicare Other | Admitting: *Deleted

## 2019-01-09 DIAGNOSIS — Z23 Encounter for immunization: Secondary | ICD-10-CM

## 2019-02-05 ENCOUNTER — Ambulatory Visit: Payer: Medicare Other

## 2019-04-05 HISTORY — PX: BREAST EXCISIONAL BIOPSY: SUR124

## 2019-04-15 ENCOUNTER — Other Ambulatory Visit: Payer: Self-pay | Admitting: Family Medicine

## 2019-04-16 NOTE — Telephone Encounter (Signed)
Last filled 10/09/2018 Last OV 10/09/2018  Ok to fill?

## 2019-04-22 ENCOUNTER — Encounter: Payer: Self-pay | Admitting: Family Medicine

## 2019-04-23 MED ORDER — ZOLPIDEM TARTRATE 10 MG PO TABS: 10.0000 mg | ORAL_TABLET | Freq: Every day | ORAL | 1 refills | Status: DC

## 2019-04-23 NOTE — Telephone Encounter (Signed)
Done

## 2019-06-17 DIAGNOSIS — H25813 Combined forms of age-related cataract, bilateral: Secondary | ICD-10-CM | POA: Diagnosis not present

## 2019-06-17 DIAGNOSIS — H52203 Unspecified astigmatism, bilateral: Secondary | ICD-10-CM | POA: Diagnosis not present

## 2019-06-17 DIAGNOSIS — H43813 Vitreous degeneration, bilateral: Secondary | ICD-10-CM | POA: Diagnosis not present

## 2019-06-17 DIAGNOSIS — H04123 Dry eye syndrome of bilateral lacrimal glands: Secondary | ICD-10-CM | POA: Diagnosis not present

## 2019-07-17 DIAGNOSIS — D2239 Melanocytic nevi of other parts of face: Secondary | ICD-10-CM | POA: Diagnosis not present

## 2019-07-17 DIAGNOSIS — D2261 Melanocytic nevi of right upper limb, including shoulder: Secondary | ICD-10-CM | POA: Diagnosis not present

## 2019-07-17 DIAGNOSIS — D1801 Hemangioma of skin and subcutaneous tissue: Secondary | ICD-10-CM | POA: Diagnosis not present

## 2019-07-17 DIAGNOSIS — D225 Melanocytic nevi of trunk: Secondary | ICD-10-CM | POA: Diagnosis not present

## 2019-07-17 DIAGNOSIS — L718 Other rosacea: Secondary | ICD-10-CM | POA: Diagnosis not present

## 2019-07-21 ENCOUNTER — Encounter: Payer: Self-pay | Admitting: Family Medicine

## 2019-07-22 MED ORDER — ZOLPIDEM TARTRATE 10 MG PO TABS: 10.0000 mg | ORAL_TABLET | Freq: Every day | ORAL | 1 refills | Status: DC

## 2019-07-22 NOTE — Telephone Encounter (Signed)
Done

## 2019-10-10 ENCOUNTER — Encounter: Payer: Self-pay | Admitting: Family Medicine

## 2019-10-10 ENCOUNTER — Ambulatory Visit (INDEPENDENT_AMBULATORY_CARE_PROVIDER_SITE_OTHER): Payer: Medicare Other | Admitting: Family Medicine

## 2019-10-10 ENCOUNTER — Other Ambulatory Visit (INDEPENDENT_AMBULATORY_CARE_PROVIDER_SITE_OTHER): Payer: Medicare Other

## 2019-10-10 ENCOUNTER — Other Ambulatory Visit: Payer: Self-pay

## 2019-10-10 VITALS — BP 130/80 | HR 66 | Temp 97.6°F | Ht 63.0 in | Wt 142.0 lb

## 2019-10-10 DIAGNOSIS — E039 Hypothyroidism, unspecified: Secondary | ICD-10-CM

## 2019-10-10 DIAGNOSIS — E782 Mixed hyperlipidemia: Secondary | ICD-10-CM | POA: Diagnosis not present

## 2019-10-10 DIAGNOSIS — I10 Essential (primary) hypertension: Secondary | ICD-10-CM | POA: Diagnosis not present

## 2019-10-10 DIAGNOSIS — G4700 Insomnia, unspecified: Secondary | ICD-10-CM

## 2019-10-10 DIAGNOSIS — D508 Other iron deficiency anemias: Secondary | ICD-10-CM | POA: Diagnosis not present

## 2019-10-10 DIAGNOSIS — E559 Vitamin D deficiency, unspecified: Secondary | ICD-10-CM

## 2019-10-10 DIAGNOSIS — Z0000 Encounter for general adult medical examination without abnormal findings: Secondary | ICD-10-CM

## 2019-10-10 LAB — CBC WITH DIFFERENTIAL/PLATELET
Basophils Absolute: 0 10*3/uL (ref 0.0–0.1)
Basophils Relative: 1 % (ref 0.0–3.0)
Eosinophils Absolute: 0.3 10*3/uL (ref 0.0–0.7)
Eosinophils Relative: 5.4 % — ABNORMAL HIGH (ref 0.0–5.0)
HCT: 42.3 % (ref 36.0–46.0)
Hemoglobin: 14.1 g/dL (ref 12.0–15.0)
Lymphocytes Relative: 27.8 % (ref 12.0–46.0)
Lymphs Abs: 1.3 10*3/uL (ref 0.7–4.0)
MCHC: 33.5 g/dL (ref 30.0–36.0)
MCV: 90.8 fl (ref 78.0–100.0)
Monocytes Absolute: 0.5 10*3/uL (ref 0.1–1.0)
Monocytes Relative: 11.1 % (ref 3.0–12.0)
Neutro Abs: 2.6 10*3/uL (ref 1.4–7.7)
Neutrophils Relative %: 54.7 % (ref 43.0–77.0)
Platelets: 200 10*3/uL (ref 150.0–400.0)
RBC: 4.66 Mil/uL (ref 3.87–5.11)
RDW: 14.9 % (ref 11.5–15.5)
WBC: 4.8 10*3/uL (ref 4.0–10.5)

## 2019-10-10 LAB — HEPATIC FUNCTION PANEL
ALT: 14 U/L (ref 0–35)
AST: 17 U/L (ref 0–37)
Albumin: 4.3 g/dL (ref 3.5–5.2)
Alkaline Phosphatase: 55 U/L (ref 39–117)
Bilirubin, Direct: 0.1 mg/dL (ref 0.0–0.3)
Total Bilirubin: 0.5 mg/dL (ref 0.2–1.2)
Total Protein: 6.9 g/dL (ref 6.0–8.3)

## 2019-10-10 LAB — BASIC METABOLIC PANEL
BUN: 20 mg/dL (ref 6–23)
CO2: 26 mEq/L (ref 19–32)
Calcium: 9.5 mg/dL (ref 8.4–10.5)
Chloride: 105 mEq/L (ref 96–112)
Creatinine, Ser: 0.93 mg/dL (ref 0.40–1.20)
GFR: 58.26 mL/min — ABNORMAL LOW (ref >60.00)
Glucose, Bld: 95 mg/dL (ref 70–99)
Potassium: 4 mEq/L (ref 3.5–5.1)
Sodium: 138 mEq/L (ref 135–145)

## 2019-10-10 LAB — LIPID PANEL
Cholesterol: 228 mg/dL — ABNORMAL HIGH (ref 0–200)
HDL: 83 mg/dL (ref >39.00)
LDL Cholesterol: 128 mg/dL — ABNORMAL HIGH (ref 0–99)
NonHDL: 144.96
Total CHOL/HDL Ratio: 3
Triglycerides: 83 mg/dL (ref 0.0–149.0)
VLDL: 16.6 mg/dL (ref 0.0–40.0)

## 2019-10-10 LAB — TSH: TSH: 2.35 u[IU]/mL (ref 0.35–4.50)

## 2019-10-10 LAB — VITAMIN D 25 HYDROXY (VIT D DEFICIENCY, FRACTURES): VITD: 30.4 ng/mL (ref 30.00–100.00)

## 2019-10-10 MED ORDER — RAMIPRIL 5 MG PO CAPS: 5.0000 mg | ORAL_CAPSULE | Freq: Every day | ORAL | 3 refills | Status: DC

## 2019-10-10 NOTE — Addendum Note (Signed)
Addended by: Boris Lown B on: 10/10/2019 11:10 AM   Modules accepted: Orders

## 2019-10-10 NOTE — Progress Notes (Signed)
   Subjective:    Patient ID: Katherine Holland, female    DOB: 1940-08-07, 79 y.o.   MRN: [id number]  HPI Here to follow up issues. She feels great. She walks every day with her husband. Her BP has been stable. Her last Cologuard test was in 2018. She has a mammogram coming up next month.    Review of Systems  Constitutional: Negative.   HENT: Negative.   Eyes: Negative.   Respiratory: Negative.   Cardiovascular: Negative.   Gastrointestinal: Negative.   Genitourinary: Negative for decreased urine volume, difficulty urinating, dyspareunia, dysuria, enuresis, flank pain, frequency, hematuria, pelvic pain and urgency.  Musculoskeletal: Negative.   Skin: Negative.   Neurological: Negative.   Psychiatric/Behavioral: Negative.        Objective:   Physical Exam Constitutional:      General: She is not in acute distress.    Appearance: She is well-developed.  HENT:     Head: Normocephalic and atraumatic.     Right Ear: External ear normal.     Left Ear: External ear normal.     Nose: Nose normal.     Mouth/Throat:     Pharynx: No oropharyngeal exudate.  Eyes:     General: No scleral icterus.    Conjunctiva/sclera: Conjunctivae normal.     Pupils: Pupils are equal, round, and reactive to light.  Neck:     Thyroid: No thyromegaly.     Vascular: No JVD.  Cardiovascular:     Rate and Rhythm: Normal rate and regular rhythm.     Heart sounds: Normal heart sounds. No murmur heard.  No friction rub. No gallop.   Pulmonary:     Effort: Pulmonary effort is normal. No respiratory distress.     Breath sounds: Normal breath sounds. No wheezing or rales.  Chest:     Chest wall: No tenderness.  Abdominal:     General: Bowel sounds are normal. There is no distension.     Palpations: Abdomen is soft. There is no mass.     Tenderness: There is no abdominal tenderness. There is no guarding or rebound.  Musculoskeletal:        General: No tenderness. Normal range of motion.     Cervical  back: Normal range of motion and neck supple.  Lymphadenopathy:     Cervical: No cervical adenopathy.  Skin:    General: Skin is warm and dry.     Findings: No erythema or rash.  Neurological:     Mental Status: She is alert and oriented to person, place, and time.     Cranial Nerves: No cranial nerve deficit.     Motor: No abnormal muscle tone.     Coordination: Coordination normal.     Deep Tendon Reflexes: Reflexes are normal and symmetric. Reflexes normal.  Psychiatric:        Behavior: Behavior normal.        Thought Content: Thought content normal.        Judgment: Judgment normal.           Assessment & Plan:  Her HTN is stable. Insomnia is well controlled. Get fasting labs to check lipids, vitamin D, etc. We will order another Cologuard test.  Alysia Penna, MD

## 2019-11-08 ENCOUNTER — Other Ambulatory Visit: Payer: Self-pay | Admitting: Family Medicine

## 2019-11-08 DIAGNOSIS — Z1231 Encounter for screening mammogram for malignant neoplasm of breast: Secondary | ICD-10-CM

## 2019-11-11 ENCOUNTER — Encounter: Payer: Self-pay | Admitting: Family Medicine

## 2019-11-11 DIAGNOSIS — Z1211 Encounter for screening for malignant neoplasm of colon: Secondary | ICD-10-CM | POA: Diagnosis not present

## 2019-11-11 LAB — COLOGUARD: Cologuard: NEGATIVE

## 2019-11-16 LAB — EXTERNAL GENERIC LAB PROCEDURE: COLOGUARD: NEGATIVE

## 2019-11-23 ENCOUNTER — Other Ambulatory Visit: Payer: Self-pay | Admitting: Family Medicine

## 2019-11-25 ENCOUNTER — Encounter: Payer: Self-pay | Admitting: Family Medicine

## 2019-11-25 ENCOUNTER — Other Ambulatory Visit: Payer: Self-pay

## 2019-11-25 ENCOUNTER — Ambulatory Visit
Admission: RE | Admit: 2019-11-25 | Discharge: 2019-11-25 | Disposition: A | Discharge status: Home / Self Care | Payer: Medicare Other | Source: Ambulatory Visit | Attending: Family Medicine | Admitting: Family Medicine

## 2019-11-25 DIAGNOSIS — Z1231 Encounter for screening mammogram for malignant neoplasm of breast: Secondary | ICD-10-CM | POA: Diagnosis not present

## 2019-11-26 NOTE — Telephone Encounter (Signed)
I believe this was negative. The report is in your folder

## 2019-11-27 ENCOUNTER — Other Ambulatory Visit: Payer: Self-pay | Admitting: Family Medicine

## 2019-11-27 DIAGNOSIS — R928 Other abnormal and inconclusive findings on diagnostic imaging of breast: Secondary | ICD-10-CM

## 2019-12-04 ENCOUNTER — Other Ambulatory Visit: Payer: Self-pay | Admitting: Family Medicine

## 2019-12-04 ENCOUNTER — Ambulatory Visit
Admission: RE | Admit: 2019-12-04 | Discharge: 2019-12-04 | Disposition: A | Discharge status: Home / Self Care | Payer: Medicare Other | Source: Ambulatory Visit | Attending: Family Medicine | Admitting: Family Medicine

## 2019-12-04 ENCOUNTER — Other Ambulatory Visit: Payer: Self-pay

## 2019-12-04 DIAGNOSIS — R928 Other abnormal and inconclusive findings on diagnostic imaging of breast: Secondary | ICD-10-CM

## 2019-12-04 DIAGNOSIS — N6313 Unspecified lump in the right breast, lower outer quadrant: Secondary | ICD-10-CM | POA: Diagnosis not present

## 2019-12-04 DIAGNOSIS — R922 Inconclusive mammogram: Secondary | ICD-10-CM | POA: Diagnosis not present

## 2019-12-13 ENCOUNTER — Telehealth: Payer: Self-pay | Admitting: Family Medicine

## 2019-12-13 NOTE — Telephone Encounter (Signed)
Left message for patient to schedule Annual Wellness Visit.  Please schedule with Nurse Health Advisor Shannon Crews, RN at [facility]  

## 2019-12-18 ENCOUNTER — Ambulatory Visit
Admission: RE | Admit: 2019-12-18 | Discharge: 2019-12-18 | Disposition: A | Discharge status: Home / Self Care | Payer: Medicare Other | Source: Ambulatory Visit | Attending: Family Medicine | Admitting: Family Medicine

## 2019-12-18 ENCOUNTER — Other Ambulatory Visit: Payer: Self-pay

## 2019-12-18 DIAGNOSIS — N6031 Fibrosclerosis of right breast: Secondary | ICD-10-CM | POA: Diagnosis not present

## 2019-12-18 DIAGNOSIS — R928 Other abnormal and inconclusive findings on diagnostic imaging of breast: Secondary | ICD-10-CM

## 2019-12-18 DIAGNOSIS — N6313 Unspecified lump in the right breast, lower outer quadrant: Secondary | ICD-10-CM | POA: Diagnosis not present

## 2020-01-09 ENCOUNTER — Other Ambulatory Visit: Payer: Self-pay | Admitting: Family Medicine

## 2020-01-10 NOTE — Telephone Encounter (Signed)
Zolpidem was last filled on 10/17/2019 for a 90 day supply.   Pt last seen PCP on 10/10/2019.  No upcoming appt scheduled at this time.   Please advise on refill.

## 2020-01-13 ENCOUNTER — Ambulatory Visit: Payer: Self-pay | Admitting: General Surgery

## 2020-01-13 DIAGNOSIS — N6310 Unspecified lump in the right breast, unspecified quadrant: Secondary | ICD-10-CM | POA: Diagnosis not present

## 2020-01-15 ENCOUNTER — Other Ambulatory Visit: Payer: Self-pay | Admitting: General Surgery

## 2020-01-15 DIAGNOSIS — N6310 Unspecified lump in the right breast, unspecified quadrant: Secondary | ICD-10-CM

## 2020-01-22 ENCOUNTER — Encounter (HOSPITAL_BASED_OUTPATIENT_CLINIC_OR_DEPARTMENT_OTHER): Payer: Self-pay | Admitting: General Surgery

## 2020-01-22 ENCOUNTER — Other Ambulatory Visit: Payer: Self-pay

## 2020-01-25 ENCOUNTER — Other Ambulatory Visit (HOSPITAL_COMMUNITY)
Admission: RE | Admit: 2020-01-25 | Discharge: 2020-01-25 | Disposition: A | Discharge status: Home / Self Care | Payer: Medicare Other | Source: Ambulatory Visit | Attending: General Surgery | Admitting: General Surgery

## 2020-01-25 DIAGNOSIS — Z20822 Contact with and (suspected) exposure to covid-19: Secondary | ICD-10-CM | POA: Insufficient documentation

## 2020-01-25 DIAGNOSIS — Z01812 Encounter for preprocedural laboratory examination: Secondary | ICD-10-CM | POA: Diagnosis not present

## 2020-01-25 LAB — SARS CORONAVIRUS 2 (TAT 6-24 HRS): SARS Coronavirus 2: NEGATIVE

## 2020-01-27 ENCOUNTER — Encounter (HOSPITAL_BASED_OUTPATIENT_CLINIC_OR_DEPARTMENT_OTHER)
Admission: RE | Admit: 2020-01-27 | Discharge: 2020-01-27 | Disposition: A | Discharge status: Home / Self Care | Payer: Medicare Other | Source: Ambulatory Visit | Attending: General Surgery | Admitting: General Surgery

## 2020-01-27 DIAGNOSIS — Z01810 Encounter for preprocedural cardiovascular examination: Secondary | ICD-10-CM | POA: Diagnosis not present

## 2020-01-27 DIAGNOSIS — I10 Essential (primary) hypertension: Secondary | ICD-10-CM | POA: Insufficient documentation

## 2020-01-27 NOTE — Progress Notes (Signed)

## 2020-01-28 ENCOUNTER — Other Ambulatory Visit: Payer: Self-pay

## 2020-01-28 ENCOUNTER — Ambulatory Visit
Admission: RE | Admit: 2020-01-28 | Discharge: 2020-01-28 | Disposition: A | Discharge status: Home / Self Care | Payer: Medicare Other | Source: Ambulatory Visit | Attending: General Surgery | Admitting: General Surgery

## 2020-01-28 DIAGNOSIS — N6310 Unspecified lump in the right breast, unspecified quadrant: Secondary | ICD-10-CM

## 2020-01-28 DIAGNOSIS — R928 Other abnormal and inconclusive findings on diagnostic imaging of breast: Secondary | ICD-10-CM | POA: Diagnosis not present

## 2020-01-29 ENCOUNTER — Ambulatory Visit (HOSPITAL_BASED_OUTPATIENT_CLINIC_OR_DEPARTMENT_OTHER): Payer: Medicare Other | Admitting: Anesthesiology

## 2020-01-29 ENCOUNTER — Ambulatory Visit (HOSPITAL_BASED_OUTPATIENT_CLINIC_OR_DEPARTMENT_OTHER)
Admission: RE | Admit: 2020-01-29 | Discharge: 2020-01-29 | Disposition: A | Discharge status: Home / Self Care | Payer: Medicare Other | Attending: General Surgery | Admitting: General Surgery

## 2020-01-29 ENCOUNTER — Other Ambulatory Visit: Payer: Self-pay

## 2020-01-29 ENCOUNTER — Encounter (HOSPITAL_BASED_OUTPATIENT_CLINIC_OR_DEPARTMENT_OTHER)
Admission: RE | Disposition: A | Discharge status: Home / Self Care | Payer: Self-pay | Source: Home / Self Care | Attending: General Surgery

## 2020-01-29 ENCOUNTER — Ambulatory Visit
Admission: RE | Admit: 2020-01-29 | Discharge: 2020-01-29 | Disposition: A | Discharge status: Home / Self Care | Payer: Medicare Other | Source: Ambulatory Visit | Attending: General Surgery | Admitting: General Surgery

## 2020-01-29 ENCOUNTER — Encounter (HOSPITAL_BASED_OUTPATIENT_CLINIC_OR_DEPARTMENT_OTHER): Payer: Self-pay | Admitting: General Surgery

## 2020-01-29 DIAGNOSIS — N6081 Other benign mammary dysplasias of right breast: Secondary | ICD-10-CM | POA: Diagnosis not present

## 2020-01-29 DIAGNOSIS — N6031 Fibrosclerosis of right breast: Secondary | ICD-10-CM | POA: Insufficient documentation

## 2020-01-29 DIAGNOSIS — Q859 Phakomatosis, unspecified: Secondary | ICD-10-CM | POA: Diagnosis not present

## 2020-01-29 DIAGNOSIS — N6313 Unspecified lump in the right breast, lower outer quadrant: Secondary | ICD-10-CM | POA: Insufficient documentation

## 2020-01-29 DIAGNOSIS — D241 Benign neoplasm of right breast: Secondary | ICD-10-CM | POA: Diagnosis not present

## 2020-01-29 DIAGNOSIS — N6489 Other specified disorders of breast: Secondary | ICD-10-CM | POA: Diagnosis not present

## 2020-01-29 DIAGNOSIS — D638 Anemia in other chronic diseases classified elsewhere: Secondary | ICD-10-CM | POA: Diagnosis not present

## 2020-01-29 DIAGNOSIS — I10 Essential (primary) hypertension: Secondary | ICD-10-CM | POA: Diagnosis not present

## 2020-01-29 DIAGNOSIS — Z803 Family history of malignant neoplasm of breast: Secondary | ICD-10-CM | POA: Diagnosis not present

## 2020-01-29 DIAGNOSIS — N6310 Unspecified lump in the right breast, unspecified quadrant: Secondary | ICD-10-CM

## 2020-01-29 DIAGNOSIS — R928 Other abnormal and inconclusive findings on diagnostic imaging of breast: Secondary | ICD-10-CM | POA: Diagnosis not present

## 2020-01-29 DIAGNOSIS — E039 Hypothyroidism, unspecified: Secondary | ICD-10-CM | POA: Diagnosis not present

## 2020-01-29 HISTORY — PX: BREAST LUMPECTOMY WITH RADIOACTIVE SEED LOCALIZATION: SHX6424

## 2020-01-29 SURGERY — BREAST LUMPECTOMY WITH RADIOACTIVE SEED LOCALIZATION
Anesthesia: General | Site: Breast | Laterality: Right

## 2020-01-29 MED ORDER — PROPOFOL 500 MG/50ML IV EMUL
INTRAVENOUS | Status: DC | PRN
  Administered 2020-01-29: 125 ug/kg/min via INTRAVENOUS

## 2020-01-29 MED ORDER — CELECOXIB 200 MG PO CAPS
ORAL_CAPSULE | ORAL | Status: AC
  Filled 2020-01-29: qty 1

## 2020-01-29 MED ORDER — DEXAMETHASONE SODIUM PHOSPHATE 10 MG/ML IJ SOLN
INTRAMUSCULAR | Status: AC
  Filled 2020-01-29: qty 1

## 2020-01-29 MED ORDER — FENTANYL CITRATE (PF) 100 MCG/2ML IJ SOLN
INTRAMUSCULAR | Status: AC
  Filled 2020-01-29: qty 2

## 2020-01-29 MED ORDER — FENTANYL CITRATE (PF) 100 MCG/2ML IJ SOLN
INTRAMUSCULAR | Status: DC | PRN
  Administered 2020-01-29: 50 ug via INTRAVENOUS

## 2020-01-29 MED ORDER — CHLORHEXIDINE GLUCONATE CLOTH 2 % EX PADS: 6.0000 | MEDICATED_PAD | Freq: Once | CUTANEOUS | Status: DC

## 2020-01-29 MED ORDER — HYDROCODONE-ACETAMINOPHEN 5-325 MG PO TABS: 1.0000 | ORAL_TABLET | Freq: Four times a day (QID) | ORAL | 0 refills | Status: DC | PRN

## 2020-01-29 MED ORDER — ACETAMINOPHEN 500 MG PO TABS
1000.0000 mg | ORAL_TABLET | ORAL | Status: AC
  Administered 2020-01-29: 1000 mg via ORAL

## 2020-01-29 MED ORDER — DEXAMETHASONE SODIUM PHOSPHATE 4 MG/ML IJ SOLN
INTRAMUSCULAR | Status: DC | PRN
  Administered 2020-01-29: 10 mg via INTRAVENOUS

## 2020-01-29 MED ORDER — ONDANSETRON HCL 4 MG/2ML IJ SOLN
INTRAMUSCULAR | Status: AC
  Filled 2020-01-29: qty 2

## 2020-01-29 MED ORDER — PROPOFOL 10 MG/ML IV BOLUS
INTRAVENOUS | Status: DC | PRN
  Administered 2020-01-29: 120 mg via INTRAVENOUS

## 2020-01-29 MED ORDER — OXYCODONE HCL 5 MG PO TABS: 5.0000 mg | ORAL_TABLET | Freq: Once | ORAL | Status: DC | PRN

## 2020-01-29 MED ORDER — CEFAZOLIN SODIUM-DEXTROSE 2-4 GM/100ML-% IV SOLN
2.0000 g | INTRAVENOUS | Status: AC
  Administered 2020-01-29: 2 g via INTRAVENOUS

## 2020-01-29 MED ORDER — OXYCODONE HCL 5 MG/5ML PO SOLN: 5.0000 mg | Freq: Once | ORAL | Status: DC | PRN

## 2020-01-29 MED ORDER — FENTANYL CITRATE (PF) 100 MCG/2ML IJ SOLN: 25.0000 ug | INTRAMUSCULAR | Status: DC | PRN

## 2020-01-29 MED ORDER — MIDAZOLAM HCL 5 MG/5ML IJ SOLN
INTRAMUSCULAR | Status: DC | PRN
  Administered 2020-01-29: 1 mg via INTRAVENOUS

## 2020-01-29 MED ORDER — LIDOCAINE HCL (CARDIAC) PF 100 MG/5ML IV SOSY
PREFILLED_SYRINGE | INTRAVENOUS | Status: DC | PRN
  Administered 2020-01-29: 80 mg via INTRAVENOUS

## 2020-01-29 MED ORDER — BUPIVACAINE-EPINEPHRINE (PF) 0.25% -1:200000 IJ SOLN
INTRAMUSCULAR | Status: DC | PRN
  Administered 2020-01-29: 10 mL via PERINEURAL

## 2020-01-29 MED ORDER — CEFAZOLIN SODIUM-DEXTROSE 2-4 GM/100ML-% IV SOLN
INTRAVENOUS | Status: AC
  Filled 2020-01-29: qty 100

## 2020-01-29 MED ORDER — ACETAMINOPHEN 500 MG PO TABS: 1000.0000 mg | ORAL_TABLET | Freq: Once | ORAL | Status: DC | PRN

## 2020-01-29 MED ORDER — ACETAMINOPHEN 500 MG PO TABS
ORAL_TABLET | ORAL | Status: AC
  Filled 2020-01-29: qty 2

## 2020-01-29 MED ORDER — ONDANSETRON HCL 4 MG/2ML IJ SOLN
INTRAMUSCULAR | Status: DC | PRN
  Administered 2020-01-29: 4 mg via INTRAVENOUS

## 2020-01-29 MED ORDER — LIDOCAINE 2% (20 MG/ML) 5 ML SYRINGE
INTRAMUSCULAR | Status: AC
  Filled 2020-01-29: qty 5

## 2020-01-29 MED ORDER — LACTATED RINGERS IV SOLN: INTRAVENOUS | Status: DC

## 2020-01-29 MED ORDER — GABAPENTIN 300 MG PO CAPS
ORAL_CAPSULE | ORAL | Status: AC
  Filled 2020-01-29: qty 1

## 2020-01-29 MED ORDER — GABAPENTIN 300 MG PO CAPS
300.0000 mg | ORAL_CAPSULE | ORAL | Status: AC
  Administered 2020-01-29: 300 mg via ORAL

## 2020-01-29 MED ORDER — ACETAMINOPHEN 160 MG/5ML PO SOLN: 1000.0000 mg | Freq: Once | ORAL | Status: DC | PRN

## 2020-01-29 MED ORDER — ACETAMINOPHEN 10 MG/ML IV SOLN: 1000.0000 mg | Freq: Once | INTRAVENOUS | Status: DC | PRN

## 2020-01-29 MED ORDER — MIDAZOLAM HCL 2 MG/2ML IJ SOLN
INTRAMUSCULAR | Status: AC
  Filled 2020-01-29: qty 2

## 2020-01-29 MED ORDER — CELECOXIB 200 MG PO CAPS
200.0000 mg | ORAL_CAPSULE | ORAL | Status: AC
  Administered 2020-01-29: 200 mg via ORAL

## 2020-01-29 SURGICAL SUPPLY — 44 items
ADH SKN CLS APL DERMABOND .7 (GAUZE/BANDAGES/DRESSINGS) ×1
APL PRP STRL LF DISP 70% ISPRP (MISCELLANEOUS) ×1
APPLIER CLIP 9.375 MED OPEN (MISCELLANEOUS)
APR CLP MED 9.3 20 MLT OPN (MISCELLANEOUS)
BLADE SURG 15 STRL LF DISP TIS (BLADE) ×1 IMPLANT
BLADE SURG 15 STRL SS (BLADE) ×3
CANISTER SUC SOCK COL 7IN (MISCELLANEOUS) ×1 IMPLANT
CANISTER SUCT 1200ML W/VALVE (MISCELLANEOUS) ×3 IMPLANT
CHLORAPREP W/TINT 26 (MISCELLANEOUS) ×3 IMPLANT
CLIP APPLIE 9.375 MED OPEN (MISCELLANEOUS) IMPLANT
COVER BACK TABLE 60X90IN (DRAPES) ×3 IMPLANT
COVER MAYO STAND STRL (DRAPES) ×3 IMPLANT
COVER PROBE W GEL 5X96 (DRAPES) ×3 IMPLANT
COVER WAND RF STERILE (DRAPES) IMPLANT
DECANTER SPIKE VIAL GLASS SM (MISCELLANEOUS) IMPLANT
DERMABOND ADVANCED (GAUZE/BANDAGES/DRESSINGS) ×2
DERMABOND ADVANCED .7 DNX12 (GAUZE/BANDAGES/DRESSINGS) ×1 IMPLANT
DRAPE LAPAROSCOPIC ABDOMINAL (DRAPES) ×3 IMPLANT
DRAPE UTILITY XL STRL (DRAPES) ×3 IMPLANT
ELECT COATED BLADE 2.86 ST (ELECTRODE) ×3 IMPLANT
ELECT REM PT RETURN 9FT ADLT (ELECTROSURGICAL) ×3
ELECTRODE REM PT RTRN 9FT ADLT (ELECTROSURGICAL) ×1 IMPLANT
GLOVE BIO SURGEON STRL SZ7.5 (GLOVE) ×6 IMPLANT
GOWN STRL REUS W/ TWL LRG LVL3 (GOWN DISPOSABLE) ×2 IMPLANT
GOWN STRL REUS W/TWL LRG LVL3 (GOWN DISPOSABLE) ×6
ILLUMINATOR WAVEGUIDE N/F (MISCELLANEOUS) IMPLANT
KIT MARKER MARGIN INK (KITS) ×3 IMPLANT
LIGHT WAVEGUIDE WIDE FLAT (MISCELLANEOUS) IMPLANT
NDL HYPO 25X1 1.5 SAFETY (NEEDLE) IMPLANT
NEEDLE HYPO 25X1 1.5 SAFETY (NEEDLE) ×3 IMPLANT
NS IRRIG 1000ML POUR BTL (IV SOLUTION) IMPLANT
PACK BASIN DAY SURGERY FS (CUSTOM PROCEDURE TRAY) ×3 IMPLANT
PENCIL SMOKE EVACUATOR (MISCELLANEOUS) ×3 IMPLANT
SLEEVE SCD COMPRESS KNEE MED (MISCELLANEOUS) ×3 IMPLANT
SPONGE LAP 18X18 RF (DISPOSABLE) ×3 IMPLANT
SUT MON AB 4-0 PC3 18 (SUTURE) ×3 IMPLANT
SUT SILK 2 0 SH (SUTURE) IMPLANT
SUT VICRYL 3-0 CR8 SH (SUTURE) ×3 IMPLANT
SYR CONTROL 10ML LL (SYRINGE) IMPLANT
TOWEL GREEN STERILE FF (TOWEL DISPOSABLE) ×3 IMPLANT
TRAY FAXITRON CT DISP (TRAY / TRAY PROCEDURE) ×3 IMPLANT
TUBE CONNECTING 20'X1/4 (TUBING)
TUBE CONNECTING 20X1/4 (TUBING) ×1 IMPLANT
YANKAUER SUCT BULB TIP NO VENT (SUCTIONS) IMPLANT

## 2020-01-29 NOTE — Transfer of Care (Signed)
Immediate Anesthesia Transfer of Care Note  Patient: Katherine Holland  Procedure(s) Performed: BREAST LUMPECTOMY WITH RADIOACTIVE SEED LOCALIZATION (Right Breast)  Patient Location: PACU  Anesthesia Type:General  Level of Consciousness: awake, alert  and oriented  Airway & Oxygen Therapy: Patient Spontanous Breathing and Patient connected to face mask oxygen  Post-op Assessment: Report given to RN and Post -op Vital signs reviewed and stable  Post vital signs: Reviewed and stable  Last Vitals:  Vitals Value Taken Time  BP 135/75 01/29/20 0923  Temp    Pulse 58 01/29/20 0926  Resp 13 01/29/20 0926  SpO2 100 % 01/29/20 0926  Vitals shown include unvalidated device data.  Last Pain:  Vitals:   01/29/20 0719  PainSc: 0-No pain         Complications: No complications documented.

## 2020-01-29 NOTE — Anesthesia Procedure Notes (Signed)
Procedure Name: LMA Insertion Date/Time: 01/29/2020 8:48 AM Performed by: [place] Rhyleigh Grassel C, CRNA Pre-anesthesia Checklist: Patient identified, Emergency Drugs available, Suction available and Patient being monitored Patient Re-evaluated:Patient Re-evaluated prior to induction Oxygen Delivery Method: Circle system utilized Preoxygenation: Pre-oxygenation with 100% oxygen Induction Type: IV induction Ventilation: Mask ventilation without difficulty LMA: LMA inserted LMA Size: 4.0 Number of attempts: 1 Airway Equipment and Method: Bite block Placement Confirmation: positive ETCO2 Tube secured with: Tape Dental Injury: Teeth and Oropharynx as per pre-operative assessment

## 2020-01-29 NOTE — H&P (Signed)
Katherine Holland  Location: [facility] Patient #: [id number] DOB: 1940-11-23 Married / Language: English / Race: White Female   History of Present Illness The patient is a 79 year old female who presents with a breast mass. We are asked to see the patient in consultation by Dr. Alysia Penna to evaluate her for a right breast mass. The patient is a 79 year old white female who recently went for a routine screening mammogram. At that time she was found to have a 9 mm mass in the outer aspect of the right breast. This was biopsied and came back as benign fibrosis. Because of the presence of the mass the radiologist felt like this should be removed. She denies any breast pain or discharge from the nipple. She has no family history of breast cancer. She is otherwise healthy and does not smoke.   Past Surgical History Appendectomy  Breast Biopsy  Right.  Diagnostic Studies History  Pap Smear  1-5 years ago  Allergies No Known Drug Allergies    Medication History Ramipril (5MG  Capsule, Oral) Active. Aspir-Low (81MG  Tablet DR, Oral) Active. Medications Reconciled  Social History  Alcohol use  Moderate alcohol use. Caffeine use  Tea. No drug use  Tobacco use  Never smoker.  Family History  Alcohol Abuse  Mother. Arthritis  Father, Mother, Sister. Breast Cancer  Family Members In General. Hypertension  Mother.  Pregnancy / Birth History  Age at menarche  66 years. Age of menopause  61-55 Gravida  2 Length (months) of breastfeeding  3-6 Maternal age  90-30 Para  2  Other Problems High blood pressure     Review of Systems General Not Present- Appetite Loss, Chills, Fatigue, Fever, Night Sweats, Weight Gain and Weight Loss. HEENT Not Present- Earache, Hearing Loss, Hoarseness, Nose Bleed, Oral Ulcers, Ringing in the Ears, Seasonal Allergies, Sinus Pain, Sore Throat, Visual Disturbances, Wears glasses/contact lenses and Yellow  Eyes. Breast Present- Breast Mass. Not Present- Breast Pain, Nipple Discharge and Skin Changes. Cardiovascular Not Present- Chest Pain, Difficulty Breathing Lying Down, Leg Cramps, Palpitations, Rapid Heart Rate, Shortness of Breath and Swelling of Extremities. Gastrointestinal Not Present- Abdominal Pain, Bloating, Bloody Stool, Change in Bowel Habits, Chronic diarrhea, Constipation, Difficulty Swallowing, Excessive gas, Gets full quickly at meals, Hemorrhoids, Indigestion, Nausea, Rectal Pain and Vomiting. Female Genitourinary Not Present- Frequency, Nocturia, Painful Urination, Pelvic Pain and Urgency.  Vitals  Weight: 146.13 lb Height: 62in Body Surface Area: 1.67 m Body Mass Index: 26.73 kg/m  Temp.: 97.77F  Pulse: 88 (Regular)  P.OX: 95% (Room air) BP: 118/70(Sitting, Left Arm, Standard)       Physical Exam  General Mental Status-Alert. General Appearance-Consistent with stated age. Hydration-Well hydrated. Voice-Normal.  Head and Neck Head-normocephalic, atraumatic with no lesions or palpable masses. Trachea-midline. Thyroid Gland Characteristics - normal size and consistency.  Eye Eyeball - Bilateral-Extraocular movements intact. Sclera/Conjunctiva - Bilateral-No scleral icterus.  Chest and Lung Exam Chest and lung exam reveals -quiet, even and easy respiratory effort with no use of accessory muscles and on auscultation, normal breath sounds, no adventitious sounds and normal vocal resonance. Inspection Chest Wall - Normal. Back - normal.  Breast Note: There is no palpable mass in either breast. There is no palpable axillary, supraclavicular, or cervical lymphadenopathy.   Cardiovascular Cardiovascular examination reveals -normal heart sounds, regular rate and rhythm with no murmurs and normal pedal pulses bilaterally.  Abdomen Inspection Inspection of the abdomen reveals - No Hernias. Skin - Scar - no surgical  scars. Palpation/Percussion  Palpation and Percussion of the abdomen reveal - Soft, Non Tender, No Rebound tenderness, No Rigidity (guarding) and No hepatosplenomegaly. Auscultation Auscultation of the abdomen reveals - Bowel sounds normal.  Neurologic Neurologic evaluation reveals -alert and oriented x 3 with no impairment of recent or remote memory. Mental Status-Normal.  Musculoskeletal Normal Exam - Left-Upper Extremity Strength Normal and Lower Extremity Strength Normal. Normal Exam - Right-Upper Extremity Strength Normal and Lower Extremity Strength Normal.  Lymphatic Head & Neck  General Head & Neck Lymphatics: Bilateral - Description - Normal. Axillary  General Axillary Region: Bilateral - Description - Normal. Tenderness - Non Tender. Femoral & Inguinal  Generalized Femoral & Inguinal Lymphatics: Bilateral - Description - Normal. Tenderness - Non Tender.    Assessment & Plan  BREAST MASS, RIGHT (N63.10) Impression: The patient appears to have a 9 mm mass in the outer aspect of the right breast that was biopsied and came back benign. The radiologist's recommend that this area be removed because of the mass that was seen. There is probably about a 5-10% chance of missing something more significant. Because of this I think it would be reasonable to remove it. I have discussed with her in detail the risks and benefits of the operation as well as some of the technical aspects including the use of a radioactive seed for localization and she understands and wishes to proceed. This patient encounter took 30 minutes today to perform the following: take history, perform exam, review outside records, interpret imaging, counsel the patient on their diagnosis and document encounter, findings & plan in the EHR

## 2020-01-29 NOTE — Anesthesia Preprocedure Evaluation (Signed)
Anesthesia Evaluation  Patient identified by MRN, date of birth, ID band Patient awake    Reviewed: Allergy & Precautions, NPO status , Patient's Chart, lab work & pertinent test results  History of Anesthesia Complications Negative for: history of anesthetic complications  Airway Mallampati: I  TM Distance: >3 FB Neck ROM: Full    Dental  (+) Dental Advisory Given, Teeth Intact   Pulmonary neg pulmonary ROS, neg recent URI,  Covid-19 Nucleic Acid Test Results Lab Results      Component                Value               Date                      SARSCOV2NAA              NEGATIVE            01/25/2020              breath sounds clear to auscultation       Cardiovascular hypertension, Pt. on medications (-) angina(-) Past MI and (-) CHF  Rhythm:Regular     Neuro/Psych negative neurological ROS  negative psych ROS   GI/Hepatic negative GI ROS, Neg liver ROS,   Endo/Other  negative endocrine ROS  Renal/GU negative Renal ROS     Musculoskeletal  (+) Arthritis ,   Abdominal   Peds  Hematology negative hematology ROS (+)   Anesthesia Other Findings   Reproductive/Obstetrics                             Anesthesia Physical Anesthesia Plan  ASA: II  Anesthesia Plan: General   Post-op Pain Management:    Induction: Intravenous  PONV Risk Score and Plan: 3 and Propofol infusion, Ondansetron and Dexamethasone  Airway Management Planned: LMA  Additional Equipment: None  Intra-op Plan:   Post-operative Plan: Extubation in OR  Informed Consent: I have reviewed the patients History and Physical, chart, labs and discussed the procedure including the risks, benefits and alternatives for the proposed anesthesia with the patient or authorized representative who has indicated his/her understanding and acceptance.     Dental advisory given  Plan Discussed with: CRNA and  Surgeon  Anesthesia Plan Comments:         Anesthesia Quick Evaluation

## 2020-01-29 NOTE — Op Note (Signed)
01/29/2020  9:15 AM  PATIENT:  Katherine Holland  79 y.o. female  PRE-OPERATIVE DIAGNOSIS:  RIGHT BREAST MASS  POST-OPERATIVE DIAGNOSIS:  RIGHT BREAST MASS  PROCEDURE:  Procedure(s): BREAST LUMPECTOMY WITH RADIOACTIVE SEED LOCALIZATION (Right)  SURGEON:  Surgeon(s) and Role:    Jovita Kussmaul, MD - Primary  PHYSICIAN ASSISTANT:   ASSISTANTS: none   ANESTHESIA:   local and general  EBL:  minimal   BLOOD ADMINISTERED:none  DRAINS: none   LOCAL MEDICATIONS USED:  MARCAINE     SPECIMEN:  Source of Specimen:  right breast tissue  DISPOSITION OF SPECIMEN:  PATHOLOGY  COUNTS:  YES  TOURNIQUET:  * No tourniquets in log *  DICTATION: .Dragon Dictation   After informed consent was obtained the patient was brought to the operating room and placed in the supine position on the operating table.  After adequate induction of general anesthesia the patient's right breast was prepped with ChloraPrep, allowed to dry, and draped in usual sterile manner.  An appropriate timeout was performed.  Previously an I-125 seed was placed in the lower outer quadrant of the right breast to mark an area of mass that was felt to be discordant.  The neoprobe was set to I-125 in the area of radioactivity was readily identified.  The area around this was infiltrated with quarter percent Marcaine.  A curvilinear incision was then made overlying the mass with a 15 blade knife.  The incision was carried through the skin and subcutaneous tissue sharply with the electrocautery.  Dissection was then carried towards the radioactive seed under the direction of the neoprobe.  Once I more closely approached the radioactive seed I then removed a circular portion of breast tissue sharply with the electrocautery around the radioactive seed while checking the area of radioactivity frequently.  Once the specimen was removed it was oriented with the appropriate paint colors.  A specimen radiograph was obtained that showed the  clip and seed to be near the center of the specimen.  The specimen was then sent to pathology for further evaluation.  Hemostasis was achieved using the Bovie electrocautery.  The wound was irrigated with saline and infiltrated with more quarter percent Marcaine.  The deep layer of the wound was then closed with layers of interrupted 3-0 Vicryl stitches.  The skin was closed with a running 4-0 Monocryl subcuticular stitch.  Dermabond dressings were applied.  The patient tolerated the procedure well.  At the end of the case all needle sponge and instrument counts were correct.  The patient was then awakened and taken to recovery in stable condition.  PLAN OF CARE: Discharge to home after PACU  PATIENT DISPOSITION:  PACU - hemodynamically stable.   Delay start of Pharmacological VTE agent (>24hrs) due to surgical blood loss or risk of bleeding: not applicable

## 2020-01-29 NOTE — Discharge Instructions (Signed)
*  No Tylenol until after 1:20 pm  *No Ibuprofen/Motrin products until after 3:20 pm   Post Anesthesia Home Care Instructions  Activity: Get plenty of rest for the remainder of the day. A responsible individual must stay with you for 24 hours following the procedure.  For the next 24 hours, DO NOT: -Drive a car -Paediatric nurse -Drink alcoholic beverages -Take any medication unless instructed by your physician -Make any legal decisions or sign important papers.  Meals: Start with liquid foods such as gelatin or soup. Progress to regular foods as tolerated. Avoid greasy, spicy, heavy foods. If nausea and/or vomiting occur, drink only clear liquids until the nausea and/or vomiting subsides. Call your physician if vomiting continues.  Special Instructions/Symptoms: Your throat may feel dry or sore from the anesthesia or the breathing tube placed in your throat during surgery. If this causes discomfort, gargle with warm salt water. The discomfort should disappear within 24 hours.  If you had a scopolamine patch placed behind your ear for the management of post- operative nausea and/or vomiting:  1. The medication in the patch is effective for 72 hours, after which it should be removed.  Wrap patch in a tissue and discard in the trash. Wash hands thoroughly with soap and water. 2. You may remove the patch earlier than 72 hours if you experience unpleasant side effects which may include dry mouth, dizziness or visual disturbances. 3. Avoid touching the patch. Wash your hands with soap and water after contact with the patch.

## 2020-01-29 NOTE — Interval H&P Note (Signed)
History and Physical Interval Note:  01/29/2020 8:29 AM  Katherine Holland  has presented today for surgery, with the diagnosis of RIGHT BREAST MASS.  The various methods of treatment have been discussed with the patient and family. After consideration of risks, benefits and other options for treatment, the patient has consented to  Procedure(s): BREAST LUMPECTOMY WITH RADIOACTIVE SEED LOCALIZATION (Right) as a surgical intervention.  The patient's history has been reviewed, patient examined, no change in status, stable for surgery.  I have reviewed the patient's chart and labs.  Questions were answered to the patient's satisfaction.     Autumn Messing III

## 2020-01-29 NOTE — Anesthesia Postprocedure Evaluation (Signed)
Anesthesia Post Note  Patient: Katherine Holland  Procedure(s) Performed: BREAST LUMPECTOMY WITH RADIOACTIVE SEED LOCALIZATION (Right Breast)     Patient location during evaluation: PACU Anesthesia Type: General Level of consciousness: awake and alert Pain management: pain level controlled Vital Signs Assessment: post-procedure vital signs reviewed and stable Respiratory status: spontaneous breathing, nonlabored ventilation, respiratory function stable and patient connected to nasal cannula oxygen Cardiovascular status: blood pressure returned to baseline and stable Postop Assessment: no apparent nausea or vomiting Anesthetic complications: no   No complications documented.  Last Vitals:  Vitals:   01/29/20 0945 01/29/20 1000  BP: 123/71 (!) 161/83  Pulse: (!) 35 60  Resp: 20 18  Temp:  (!) 36.4 C  SpO2: 96% 98%    Last Pain:  Vitals:   01/29/20 1000  PainSc: 4                  Kourtni Stineman

## 2020-01-30 ENCOUNTER — Encounter (HOSPITAL_BASED_OUTPATIENT_CLINIC_OR_DEPARTMENT_OTHER): Payer: Self-pay | Admitting: General Surgery

## 2020-01-30 LAB — SURGICAL PATHOLOGY

## 2020-02-26 ENCOUNTER — Other Ambulatory Visit: Payer: Self-pay | Admitting: Family Medicine

## 2020-02-26 MED ORDER — RAMIPRIL 5 MG PO CAPS
5.0000 mg | ORAL_CAPSULE | Freq: Every day | ORAL | 0 refills | Status: DC
Start: 2020-02-26 — End: 2020-05-29

## 2020-04-22 ENCOUNTER — Telehealth: Payer: Self-pay | Admitting: Family Medicine

## 2020-04-22 NOTE — Telephone Encounter (Signed)
Spoke with pt she stated she was shopping and could not schedule she will call the office to  schedule Medicare Annual Wellness Visit (AWV) either virtually or in office.   Last AWV 07/2011 please schedule at anytime with [facility] Nurse Health Advisor 1 or 2   This should be a 45 minute visit.

## 2020-05-26 ENCOUNTER — Other Ambulatory Visit: Payer: Self-pay | Admitting: Family Medicine

## 2020-05-26 DIAGNOSIS — Z1231 Encounter for screening mammogram for malignant neoplasm of breast: Secondary | ICD-10-CM

## 2020-05-28 ENCOUNTER — Other Ambulatory Visit: Payer: Self-pay | Admitting: Family Medicine

## 2020-05-29 ENCOUNTER — Other Ambulatory Visit: Payer: Self-pay | Admitting: *Deleted

## 2020-05-29 MED ORDER — RAMIPRIL 5 MG PO CAPS
5.0000 mg | ORAL_CAPSULE | Freq: Every day | ORAL | 0 refills | Status: DC
Start: 2020-05-29 — End: 2020-10-12

## 2020-06-08 DIAGNOSIS — H52203 Unspecified astigmatism, bilateral: Secondary | ICD-10-CM | POA: Diagnosis not present

## 2020-06-08 DIAGNOSIS — H43813 Vitreous degeneration, bilateral: Secondary | ICD-10-CM | POA: Diagnosis not present

## 2020-06-08 DIAGNOSIS — H25813 Combined forms of age-related cataract, bilateral: Secondary | ICD-10-CM | POA: Diagnosis not present

## 2020-06-08 DIAGNOSIS — H04123 Dry eye syndrome of bilateral lacrimal glands: Secondary | ICD-10-CM | POA: Diagnosis not present

## 2020-06-30 ENCOUNTER — Encounter: Payer: Self-pay | Admitting: Family Medicine

## 2020-07-01 MED ORDER — ZOLPIDEM TARTRATE 10 MG PO TABS
10.0000 mg | ORAL_TABLET | Freq: Every day | ORAL | 1 refills | Status: DC
Start: 2020-07-01 — End: 2020-12-22

## 2020-07-01 NOTE — Telephone Encounter (Signed)
Done

## 2020-09-01 ENCOUNTER — Telehealth: Payer: Self-pay | Admitting: Family Medicine

## 2020-09-01 NOTE — Telephone Encounter (Signed)
Left message for patient to call back and schedule Medicare Annual Wellness Visit (AWV) either virtually or in office.   Last AWV 07/13/11  please schedule at anytime with [facility] Nurse Health Advisor 1 or 2   This should be a 45 minute visit.

## 2020-10-12 ENCOUNTER — Encounter: Payer: Self-pay | Admitting: Family Medicine

## 2020-10-12 ENCOUNTER — Other Ambulatory Visit: Payer: Self-pay

## 2020-10-12 ENCOUNTER — Encounter: Payer: Medicare Other | Admitting: Family Medicine

## 2020-10-12 ENCOUNTER — Ambulatory Visit (INDEPENDENT_AMBULATORY_CARE_PROVIDER_SITE_OTHER): Payer: Medicare Other | Admitting: Family Medicine

## 2020-10-12 VITALS — BP 110/68 | HR 64 | Temp 97.2°F | Ht 62.5 in | Wt 139.5 lb

## 2020-10-12 DIAGNOSIS — I10 Essential (primary) hypertension: Secondary | ICD-10-CM | POA: Diagnosis not present

## 2020-10-12 DIAGNOSIS — E039 Hypothyroidism, unspecified: Secondary | ICD-10-CM | POA: Diagnosis not present

## 2020-10-12 DIAGNOSIS — G4700 Insomnia, unspecified: Secondary | ICD-10-CM | POA: Diagnosis not present

## 2020-10-12 DIAGNOSIS — E782 Mixed hyperlipidemia: Secondary | ICD-10-CM | POA: Diagnosis not present

## 2020-10-12 DIAGNOSIS — R739 Hyperglycemia, unspecified: Secondary | ICD-10-CM

## 2020-10-12 DIAGNOSIS — M810 Age-related osteoporosis without current pathological fracture: Secondary | ICD-10-CM

## 2020-10-12 DIAGNOSIS — D508 Other iron deficiency anemias: Secondary | ICD-10-CM

## 2020-10-12 LAB — CBC WITH DIFFERENTIAL/PLATELET
Basophils Absolute: 0.1 10*3/uL (ref 0.0–0.1)
Basophils Relative: 1 % (ref 0.0–3.0)
Eosinophils Absolute: 0.2 10*3/uL (ref 0.0–0.7)
Eosinophils Relative: 4.4 % (ref 0.0–5.0)
HCT: 40.7 % (ref 36.0–46.0)
Hemoglobin: 13.8 g/dL (ref 12.0–15.0)
Lymphocytes Relative: 30.6 % (ref 12.0–46.0)
Lymphs Abs: 1.5 10*3/uL (ref 0.7–4.0)
MCHC: 33.9 g/dL (ref 30.0–36.0)
MCV: 88.3 fl (ref 78.0–100.0)
Monocytes Absolute: 0.4 10*3/uL (ref 0.1–1.0)
Monocytes Relative: 8.7 % (ref 3.0–12.0)
Neutro Abs: 2.7 10*3/uL (ref 1.4–7.7)
Neutrophils Relative %: 55.3 % (ref 43.0–77.0)
Platelets: 231 10*3/uL (ref 150.0–400.0)
RBC: 4.61 Mil/uL (ref 3.87–5.11)
RDW: 14.8 % (ref 11.5–15.5)
WBC: 4.9 10*3/uL (ref 4.0–10.5)

## 2020-10-12 LAB — T3, FREE: T3, Free: 3.3 pg/mL (ref 2.3–4.2)

## 2020-10-12 LAB — LIPID PANEL
Cholesterol: 220 mg/dL — ABNORMAL HIGH (ref 0–200)
HDL: 71.6 mg/dL (ref >39.00)
LDL Cholesterol: 127 mg/dL — ABNORMAL HIGH (ref 0–99)
NonHDL: 148.42
Total CHOL/HDL Ratio: 3
Triglycerides: 106 mg/dL (ref 0.0–149.0)
VLDL: 21.2 mg/dL (ref 0.0–40.0)

## 2020-10-12 LAB — HEPATIC FUNCTION PANEL
ALT: 13 U/L (ref 0–35)
AST: 16 U/L (ref 0–37)
Albumin: 4.1 g/dL (ref 3.5–5.2)
Alkaline Phosphatase: 55 U/L (ref 39–117)
Bilirubin, Direct: 0.1 mg/dL (ref 0.0–0.3)
Total Bilirubin: 0.6 mg/dL (ref 0.2–1.2)
Total Protein: 6.3 g/dL (ref 6.0–8.3)

## 2020-10-12 LAB — BASIC METABOLIC PANEL
BUN: 14 mg/dL (ref 6–23)
CO2: 26 mEq/L (ref 19–32)
Calcium: 9.4 mg/dL (ref 8.4–10.5)
Chloride: 106 mEq/L (ref 96–112)
Creatinine, Ser: 0.86 mg/dL (ref 0.40–1.20)
GFR: 64.3 mL/min (ref >60.00)
Glucose, Bld: 90 mg/dL (ref 70–99)
Potassium: 4.2 mEq/L (ref 3.5–5.1)
Sodium: 140 mEq/L (ref 135–145)

## 2020-10-12 LAB — TSH: TSH: 2.75 u[IU]/mL (ref 0.35–5.50)

## 2020-10-12 LAB — T4, FREE: Free T4: 1.03 ng/dL (ref 0.60–1.60)

## 2020-10-12 LAB — HEMOGLOBIN A1C: Hgb A1c MFr Bld: 5.7 % (ref 4.6–6.5)

## 2020-10-12 MED ORDER — RAMIPRIL 5 MG PO CAPS
5.0000 mg | ORAL_CAPSULE | Freq: Every day | ORAL | 3 refills | Status: DC
Start: 2020-10-12 — End: 2021-10-13

## 2020-10-12 NOTE — Progress Notes (Signed)
   Subjective:    Patient ID: Katherine Holland, female    DOB: [birth year], 80 y.o.   MRN: [id number]  HPI Here to follow up on issues. She feels fine. Her BP has been stable. She sleeps well with Ambien. She had a right breast lumpectomy last October and this went well. She exercises every day.    Review of Systems  Constitutional: Negative.   HENT: Negative.    Eyes: Negative.   Respiratory: Negative.    Cardiovascular: Negative.   Gastrointestinal: Negative.   Genitourinary:  Negative for decreased urine volume, difficulty urinating, dyspareunia, dysuria, enuresis, flank pain, frequency, hematuria, pelvic pain and urgency.  Musculoskeletal: Negative.   Skin: Negative.   Neurological: Negative.  Negative for headaches.  Psychiatric/Behavioral: Negative.        Objective:   Physical Exam Constitutional:      General: She is not in acute distress.    Appearance: Normal appearance. She is well-developed.  HENT:     Head: Normocephalic and atraumatic.     Right Ear: External ear normal.     Left Ear: External ear normal.     Nose: Nose normal.     Mouth/Throat:     Pharynx: No oropharyngeal exudate.  Eyes:     General: No scleral icterus.    Conjunctiva/sclera: Conjunctivae normal.     Pupils: Pupils are equal, round, and reactive to light.  Neck:     Thyroid: No thyromegaly.     Vascular: No JVD.  Cardiovascular:     Rate and Rhythm: Normal rate and regular rhythm.     Heart sounds: Normal heart sounds. No murmur heard.   No friction rub. No gallop.  Pulmonary:     Effort: Pulmonary effort is normal. No respiratory distress.     Breath sounds: Normal breath sounds. No wheezing or rales.  Chest:     Chest wall: No tenderness.  Abdominal:     General: Bowel sounds are normal. There is no distension.     Palpations: Abdomen is soft. There is no mass.     Tenderness: There is no abdominal tenderness. There is no guarding or rebound.  Musculoskeletal:        General:  No tenderness. Normal range of motion.     Cervical back: Normal range of motion and neck supple.  Lymphadenopathy:     Cervical: No cervical adenopathy.  Skin:    General: Skin is warm and dry.     Findings: No erythema or rash.  Neurological:     Mental Status: She is alert and oriented to person, place, and time.     Cranial Nerves: No cranial nerve deficit.     Motor: No abnormal muscle tone.     Coordination: Coordination normal.     Deep Tendon Reflexes: Reflexes are normal and symmetric. Reflexes normal.  Psychiatric:        Behavior: Behavior normal.        Thought Content: Thought content normal.        Judgment: Judgment normal.          Assessment & Plan:  She seems to be doing well. Her HTN is well controlled. Her insomnia is stable. We will get fasting labs to check her lipids, Hgb, etc. Set up another DEXA soon. We spent 35 minutes discussing these issues.   Alysia Penna, MD

## 2020-10-21 ENCOUNTER — Telehealth: Payer: Self-pay | Admitting: Family Medicine

## 2020-10-21 NOTE — Telephone Encounter (Signed)
Left message for patient to call back and schedule Medicare Annual Wellness Visit (AWV) either virtually or in office.   Last AWV 07/13/11  please schedule at anytime with [facility] Nurse Health Advisor 1 or 2   This should be a 45 minute visit.

## 2020-11-05 DIAGNOSIS — L570 Actinic keratosis: Secondary | ICD-10-CM | POA: Diagnosis not present

## 2020-11-05 DIAGNOSIS — D2262 Melanocytic nevi of left upper limb, including shoulder: Secondary | ICD-10-CM | POA: Diagnosis not present

## 2020-11-05 DIAGNOSIS — D225 Melanocytic nevi of trunk: Secondary | ICD-10-CM | POA: Diagnosis not present

## 2020-11-05 DIAGNOSIS — D1801 Hemangioma of skin and subcutaneous tissue: Secondary | ICD-10-CM | POA: Diagnosis not present

## 2020-11-05 DIAGNOSIS — L718 Other rosacea: Secondary | ICD-10-CM | POA: Diagnosis not present

## 2020-11-05 DIAGNOSIS — L821 Other seborrheic keratosis: Secondary | ICD-10-CM | POA: Diagnosis not present

## 2020-11-24 ENCOUNTER — Telehealth: Payer: Self-pay | Admitting: Family Medicine

## 2020-11-24 NOTE — Telephone Encounter (Signed)
Left message for patient to call back and schedule Medicare Annual Wellness Visit (AWV) either virtually or in office. Left  my jabber number [phone]    awvi 07/03/12 per [facility]   please schedule at anytime with [facility] Nurse Health Advisor 1 or 2   This should be a 45 minute visit.  

## 2020-11-26 ENCOUNTER — Other Ambulatory Visit: Payer: Self-pay | Admitting: Family Medicine

## 2020-11-26 ENCOUNTER — Other Ambulatory Visit: Payer: Self-pay

## 2020-11-26 ENCOUNTER — Ambulatory Visit
Admission: RE | Admit: 2020-11-26 | Discharge: 2020-11-26 | Disposition: A | Discharge status: Home / Self Care | Payer: Medicare Other | Source: Ambulatory Visit | Attending: Family Medicine | Admitting: Family Medicine

## 2020-11-26 DIAGNOSIS — Z1231 Encounter for screening mammogram for malignant neoplasm of breast: Secondary | ICD-10-CM | POA: Diagnosis not present

## 2020-12-01 ENCOUNTER — Other Ambulatory Visit: Payer: Self-pay | Admitting: Family Medicine

## 2020-12-21 ENCOUNTER — Other Ambulatory Visit: Payer: Self-pay | Admitting: Family Medicine

## 2020-12-22 NOTE — Telephone Encounter (Signed)
LOV was on 10/12/2020 Last refill done on 07/01/2020 for 90 tablets with no refill Please advise

## 2021-01-11 ENCOUNTER — Ambulatory Visit (INDEPENDENT_AMBULATORY_CARE_PROVIDER_SITE_OTHER): Payer: Medicare Other

## 2021-01-11 ENCOUNTER — Other Ambulatory Visit: Payer: Self-pay

## 2021-01-11 DIAGNOSIS — Z23 Encounter for immunization: Secondary | ICD-10-CM

## 2021-02-01 ENCOUNTER — Telehealth: Payer: Self-pay | Admitting: Family Medicine

## 2021-02-01 NOTE — Telephone Encounter (Signed)
Left message for patient to call back and schedule Medicare Annual Wellness Visit (AWV) either virtually or in office. Left  my jabber number [phone]    awvi 07/03/12 per [facility]   please schedule at anytime with [facility] Nurse Health Advisor 1 or 2   This should be a 45 minute visit.  

## 2021-04-07 DIAGNOSIS — H524 Presbyopia: Secondary | ICD-10-CM | POA: Diagnosis not present

## 2021-04-07 DIAGNOSIS — H04123 Dry eye syndrome of bilateral lacrimal glands: Secondary | ICD-10-CM | POA: Diagnosis not present

## 2021-04-07 DIAGNOSIS — H43813 Vitreous degeneration, bilateral: Secondary | ICD-10-CM | POA: Diagnosis not present

## 2021-04-07 DIAGNOSIS — H2513 Age-related nuclear cataract, bilateral: Secondary | ICD-10-CM | POA: Diagnosis not present

## 2021-04-20 ENCOUNTER — Ambulatory Visit
Admission: RE | Admit: 2021-04-20 | Discharge: 2021-04-20 | Disposition: A | Discharge status: Home / Self Care | Payer: Medicare Other | Source: Ambulatory Visit | Attending: Family Medicine | Admitting: Family Medicine

## 2021-04-20 ENCOUNTER — Other Ambulatory Visit: Payer: Self-pay | Admitting: Family Medicine

## 2021-04-20 DIAGNOSIS — Z780 Asymptomatic menopausal state: Secondary | ICD-10-CM | POA: Diagnosis not present

## 2021-04-20 DIAGNOSIS — M85852 Other specified disorders of bone density and structure, left thigh: Secondary | ICD-10-CM | POA: Diagnosis not present

## 2021-04-20 DIAGNOSIS — Z1231 Encounter for screening mammogram for malignant neoplasm of breast: Secondary | ICD-10-CM

## 2021-04-20 DIAGNOSIS — M810 Age-related osteoporosis without current pathological fracture: Secondary | ICD-10-CM

## 2021-05-18 ENCOUNTER — Encounter (HOSPITAL_COMMUNITY): Payer: Self-pay

## 2021-06-09 IMAGING — MG MM DIGITAL DIAGNOSTIC UNILAT*R* W/ TOMO W/ CAD
2 series · 3 of 6 positions shown · non-contrast
Comparison: Previous exam(s).

CLINICAL DATA: The patient was called back for 2 right breast
asymmetries.

EXAM:
DIGITAL DIAGNOSTIC RIGHT MAMMOGRAM WITH TOMO
ULTRASOUND RIGHT BREAST

[R CC synth-2D]
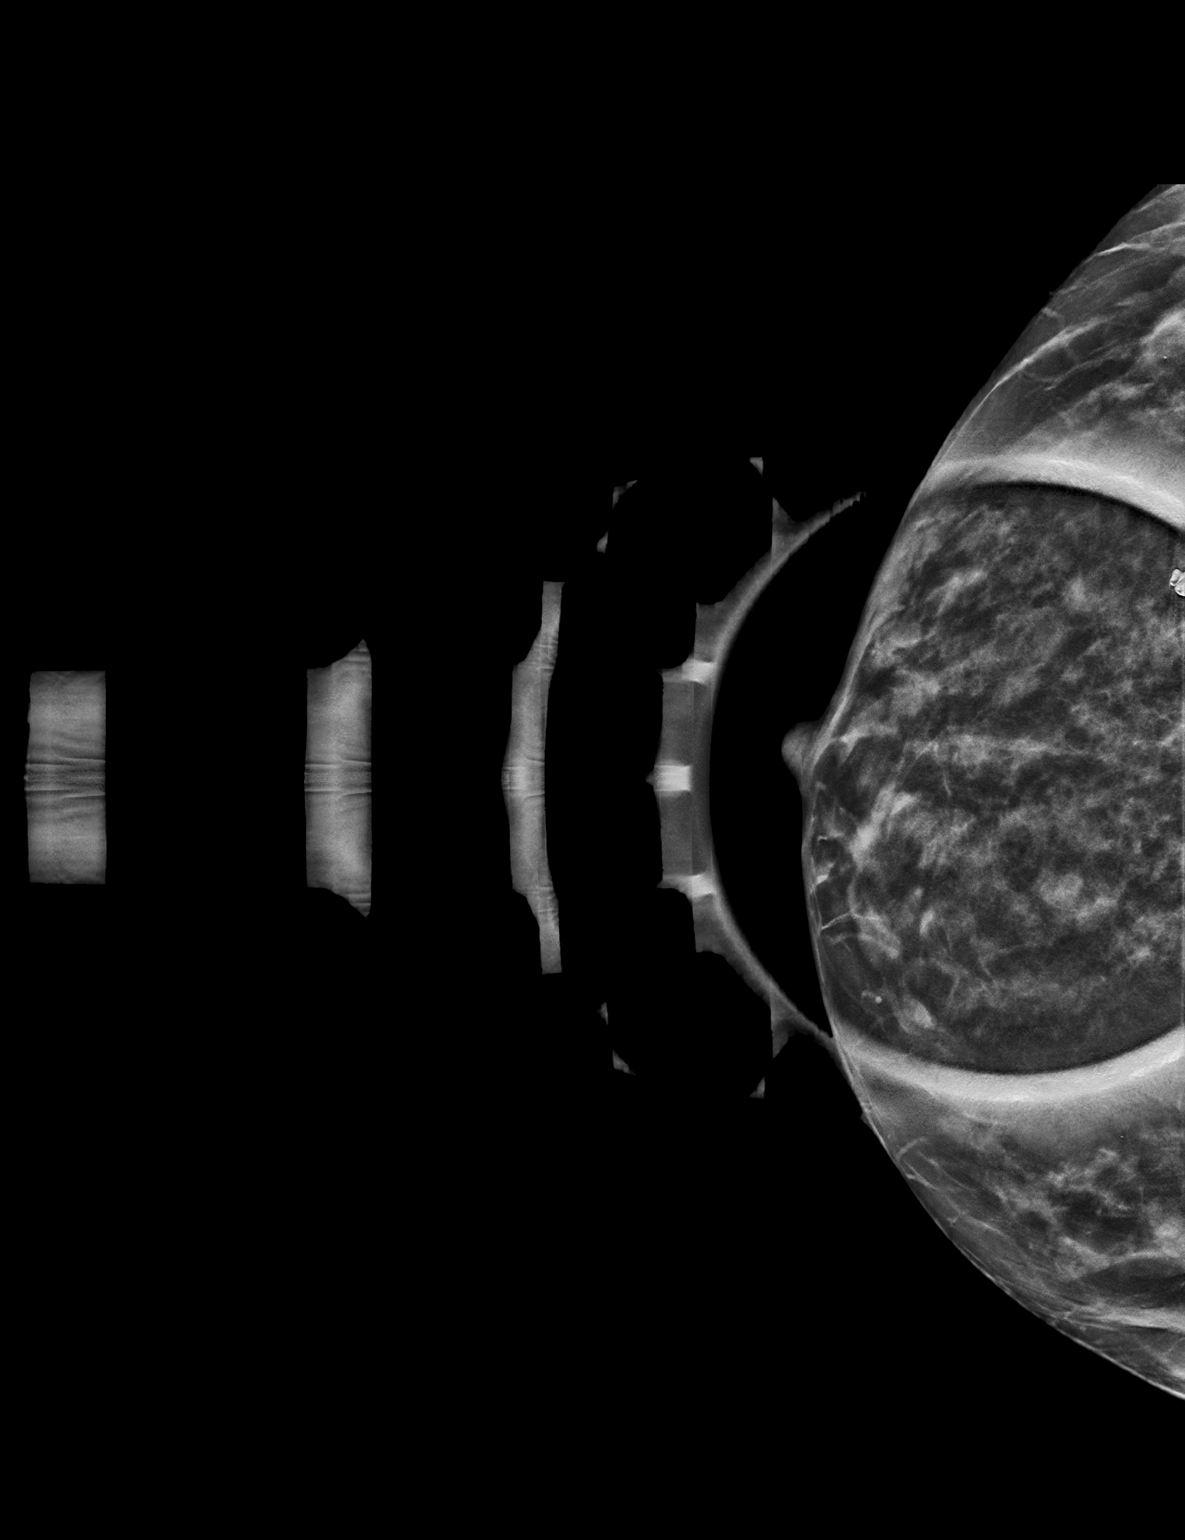

[R ML tomo · 2 of 49 frames shown]
[frame 16/49]
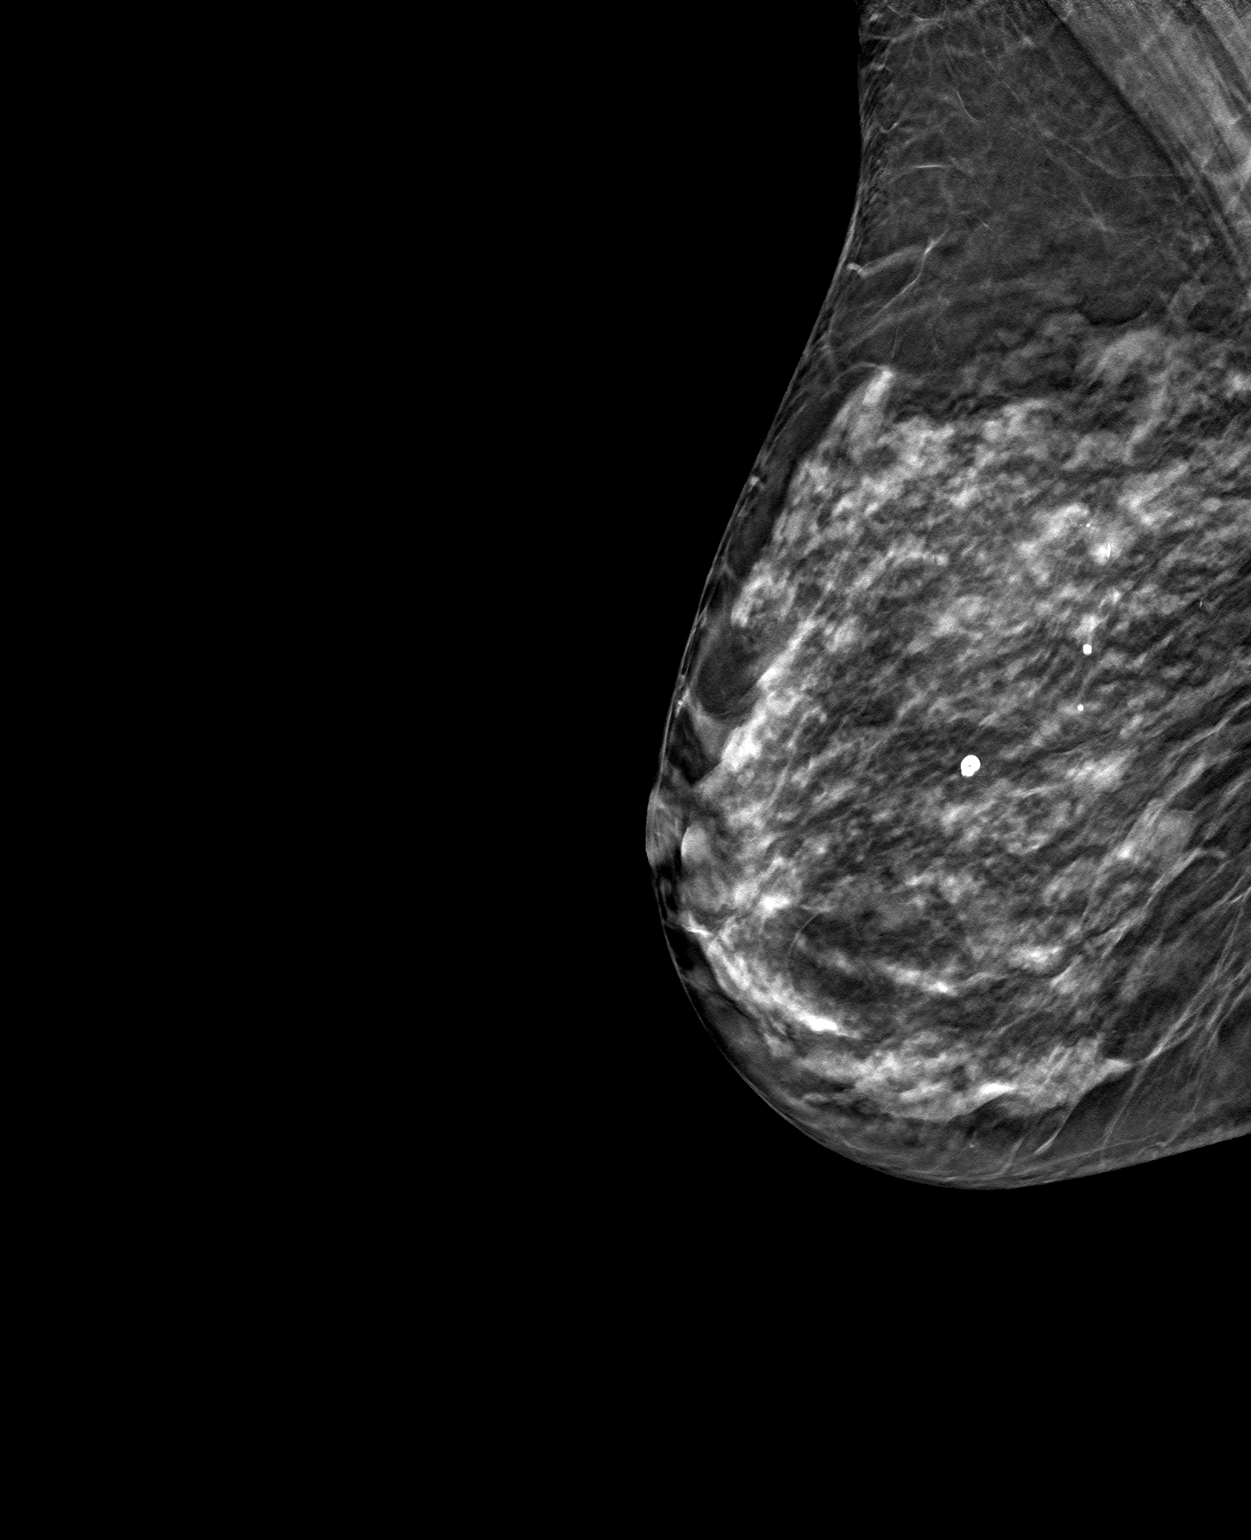
[frame 25/49]
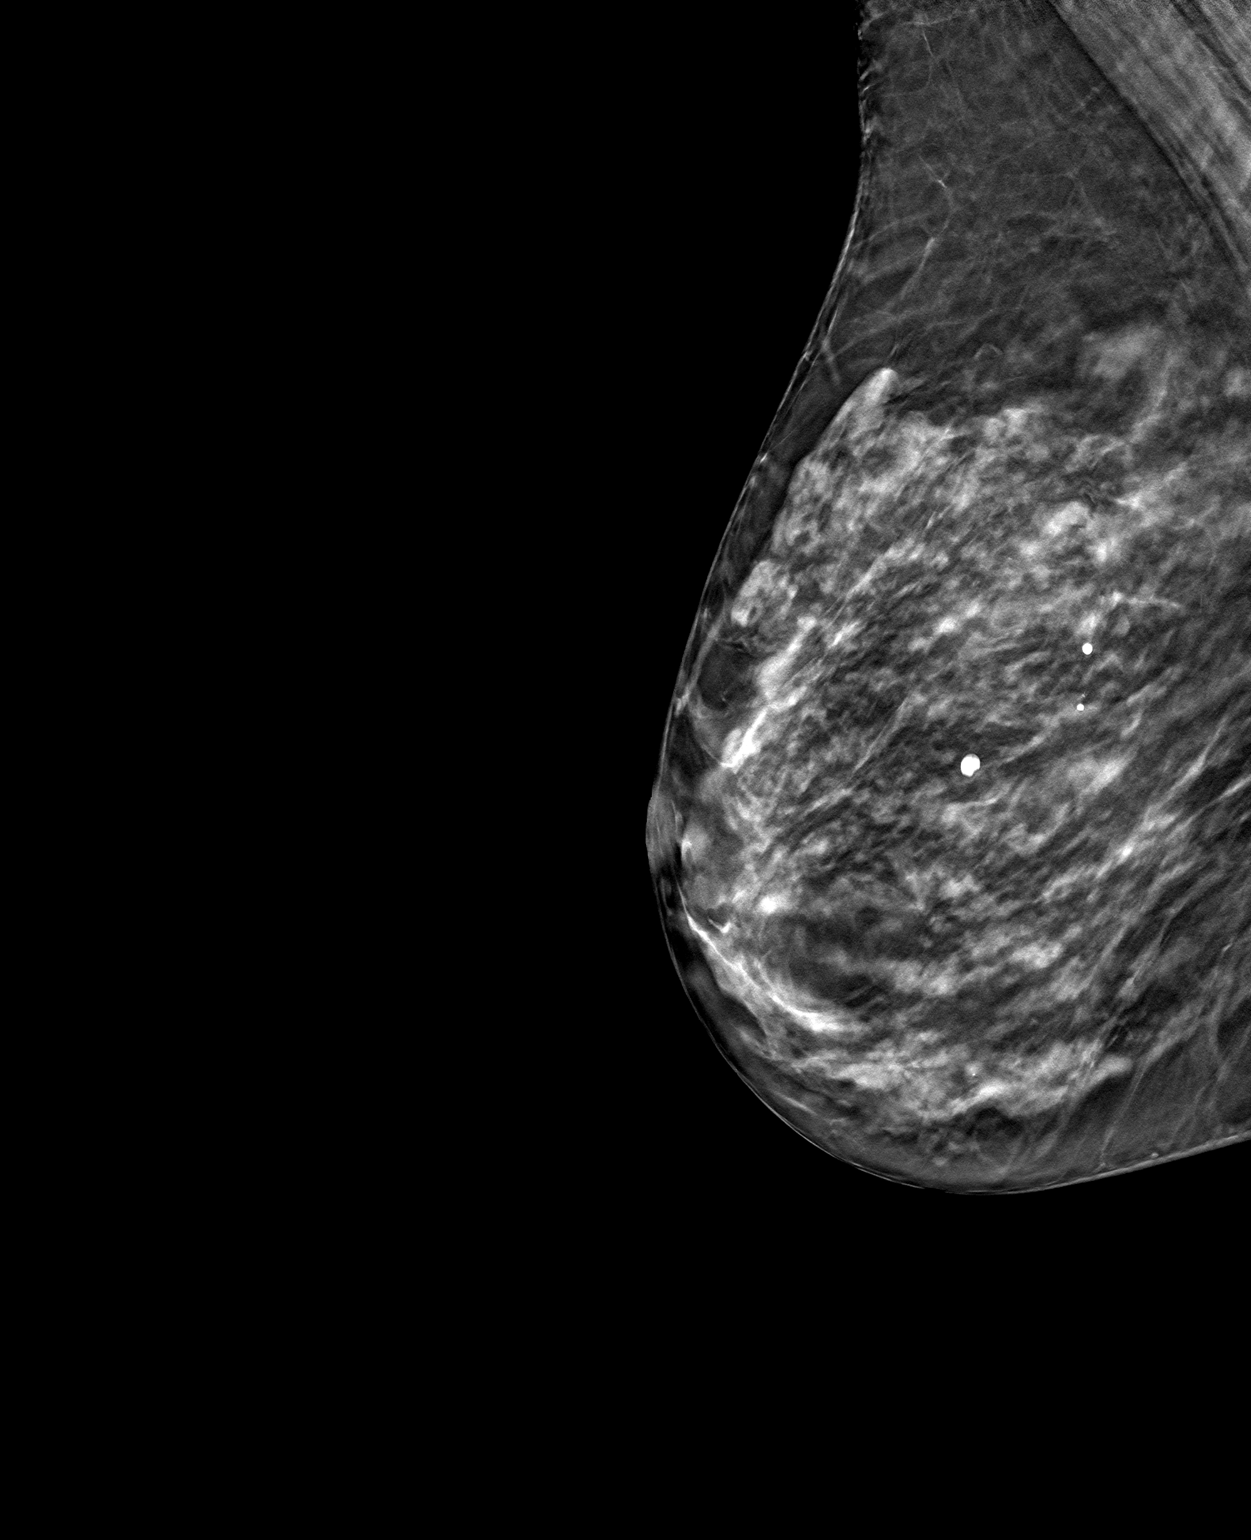

[3 of 6 positions shown; findings below may reference images not displayed]

ACR Breast Density Category c: The breast tissue is heterogeneously
dense, which may obscure small masses.
FINDINGS: The right breast asymmetry seen on the cc view anteriorly resolves
on additional imaging with no changes seen in this region. The
asymmetry on the right MLO view near the level the nipple persists
on today's imaging and there appears to be an obscured mass in this
location. This appears to represent a change compared to previous
studies.

On physical exam, no suspicious lumps are identified.

Targeted ultrasound is performed, showing a mass in the right breast
at 8 o'clock, 6 cm from the nipple measuring 9 by 7 x 6 mm, possibly
correlating with the mammographic finding. No axillary adenopathy.
No abnormalities seen at 5 o'clock in the right breast.
IMPRESSION: There is a 9 x 7 x 6 mm mass in the right breast at 8 o'clock, 6 cm
from the nipple on ultrasound which is thought to correlate with the
mammographic finding.

RECOMMENDATION:
Recommend ultrasound-guided biopsy of the 9 mm mass at 8 o'clock in
the right breast.

I have discussed the findings and recommendations with the patient.
If applicable, a reminder letter will be sent to the patient
regarding the next appointment.

BI-RADS CATEGORY  4: Suspicious.

## 2021-06-17 ENCOUNTER — Encounter: Payer: Self-pay | Admitting: Family Medicine

## 2021-06-18 MED ORDER — ZOLPIDEM TARTRATE 10 MG PO TABS
10.0000 mg | ORAL_TABLET | Freq: Every day | ORAL | 1 refills | Status: DC
Start: 2021-06-18 — End: 2021-09-17

## 2021-06-18 NOTE — Telephone Encounter (Signed)
Done

## 2021-06-23 IMAGING — MG MM BREAST LOCALIZATION CLIP
4 series · 4 of 12 positions shown · non-contrast
Comparison: Previous exam(s).

CLINICAL DATA: Evaluate RIBBON clip placement following
ultrasound-guided RIGHT breast biopsy.

EXAM:
3D DIAGNOSTIC RIGHT MAMMOGRAM POST ULTRASOUND BIOPSY

[R ML synth-2D]
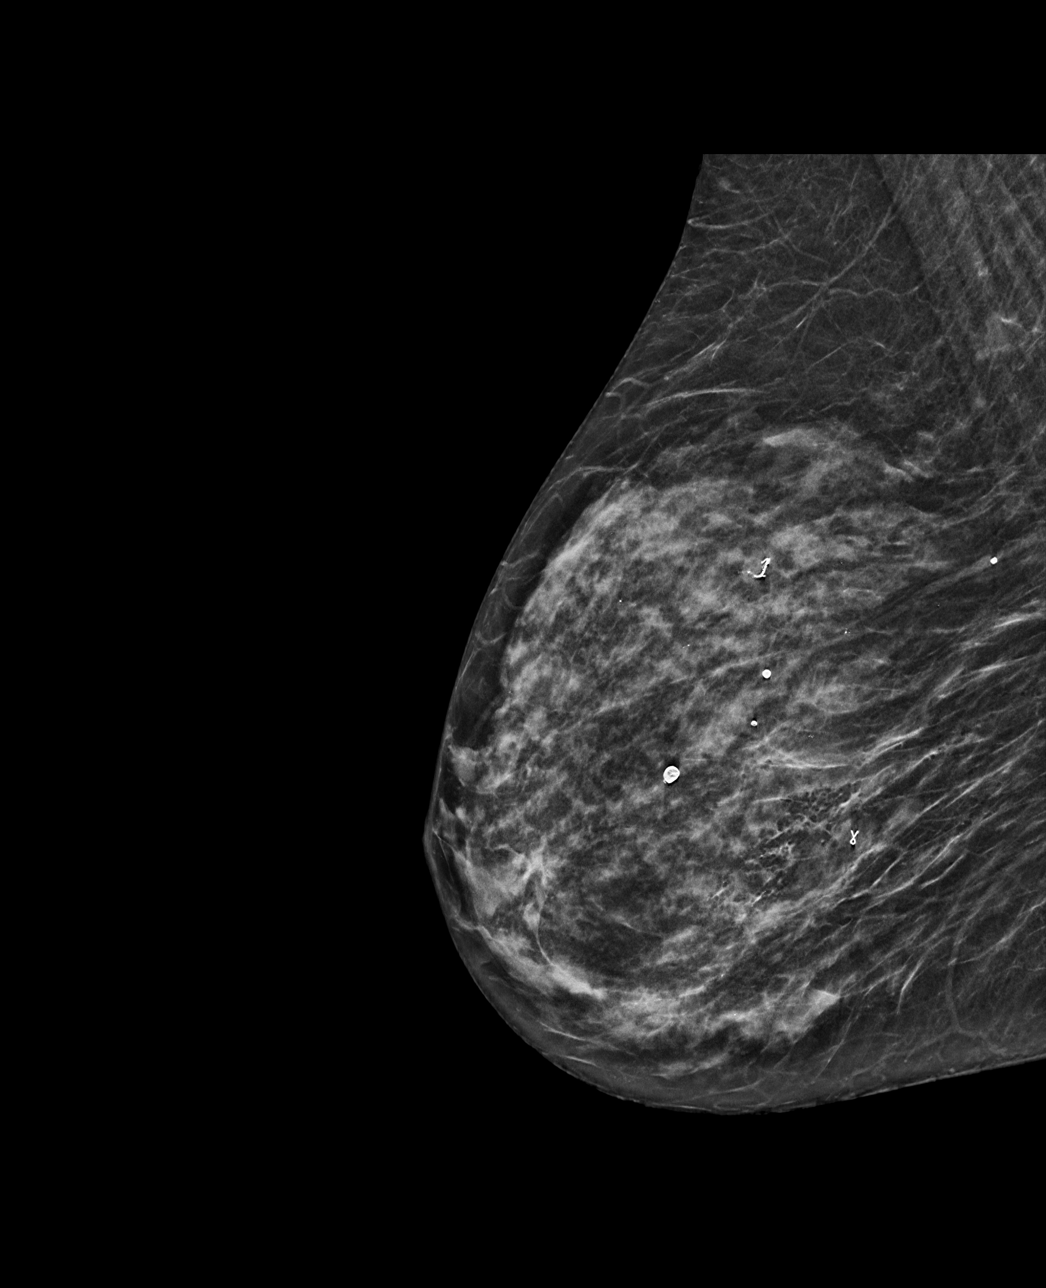

[R CC synth-2D]
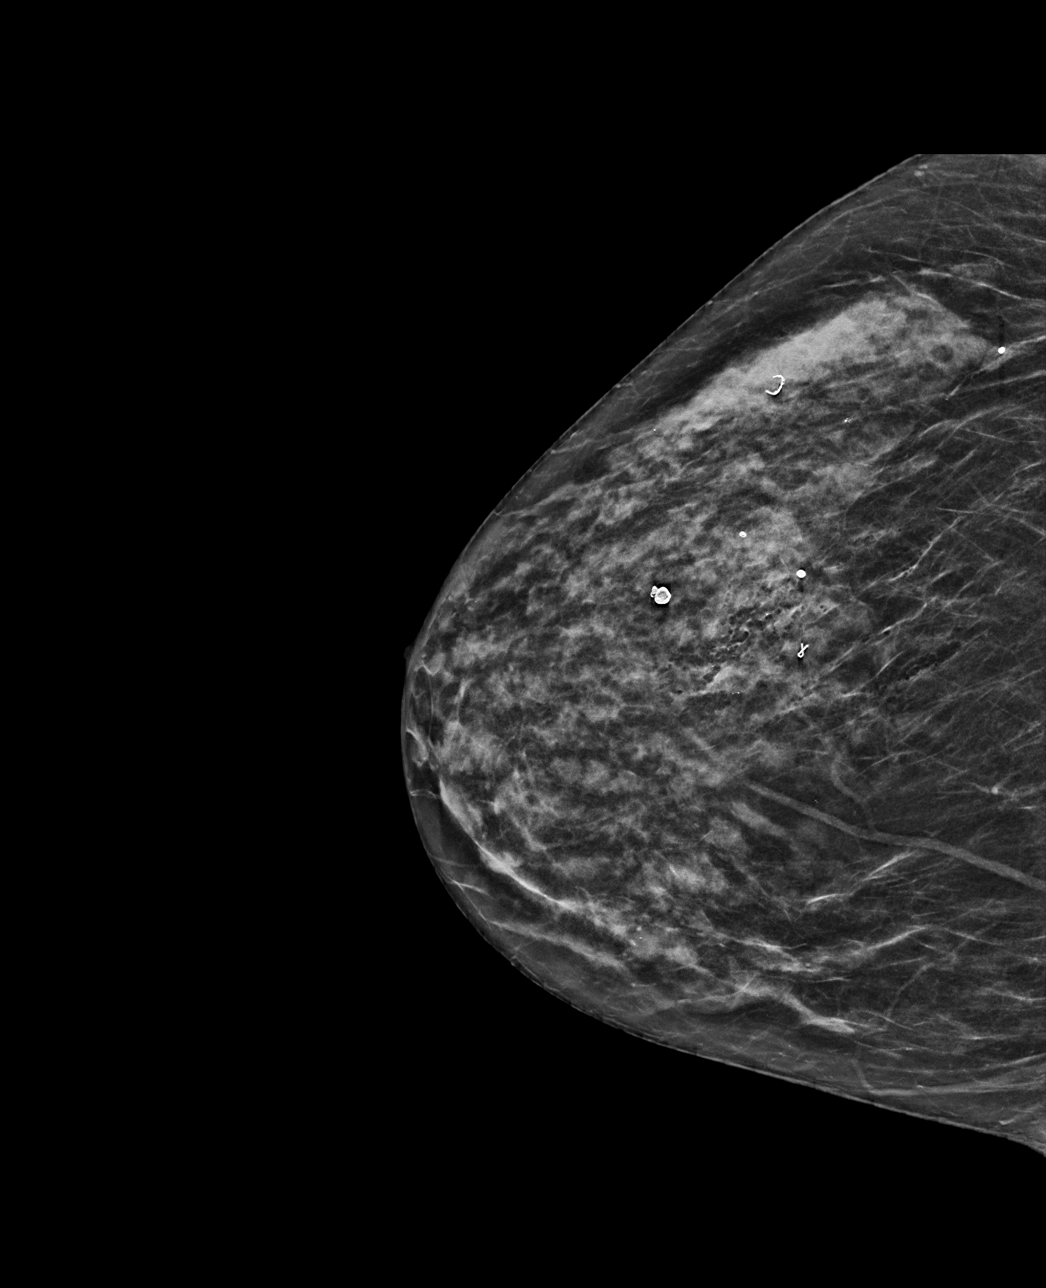

[R CC tomo · tomo slice 26/51.0]
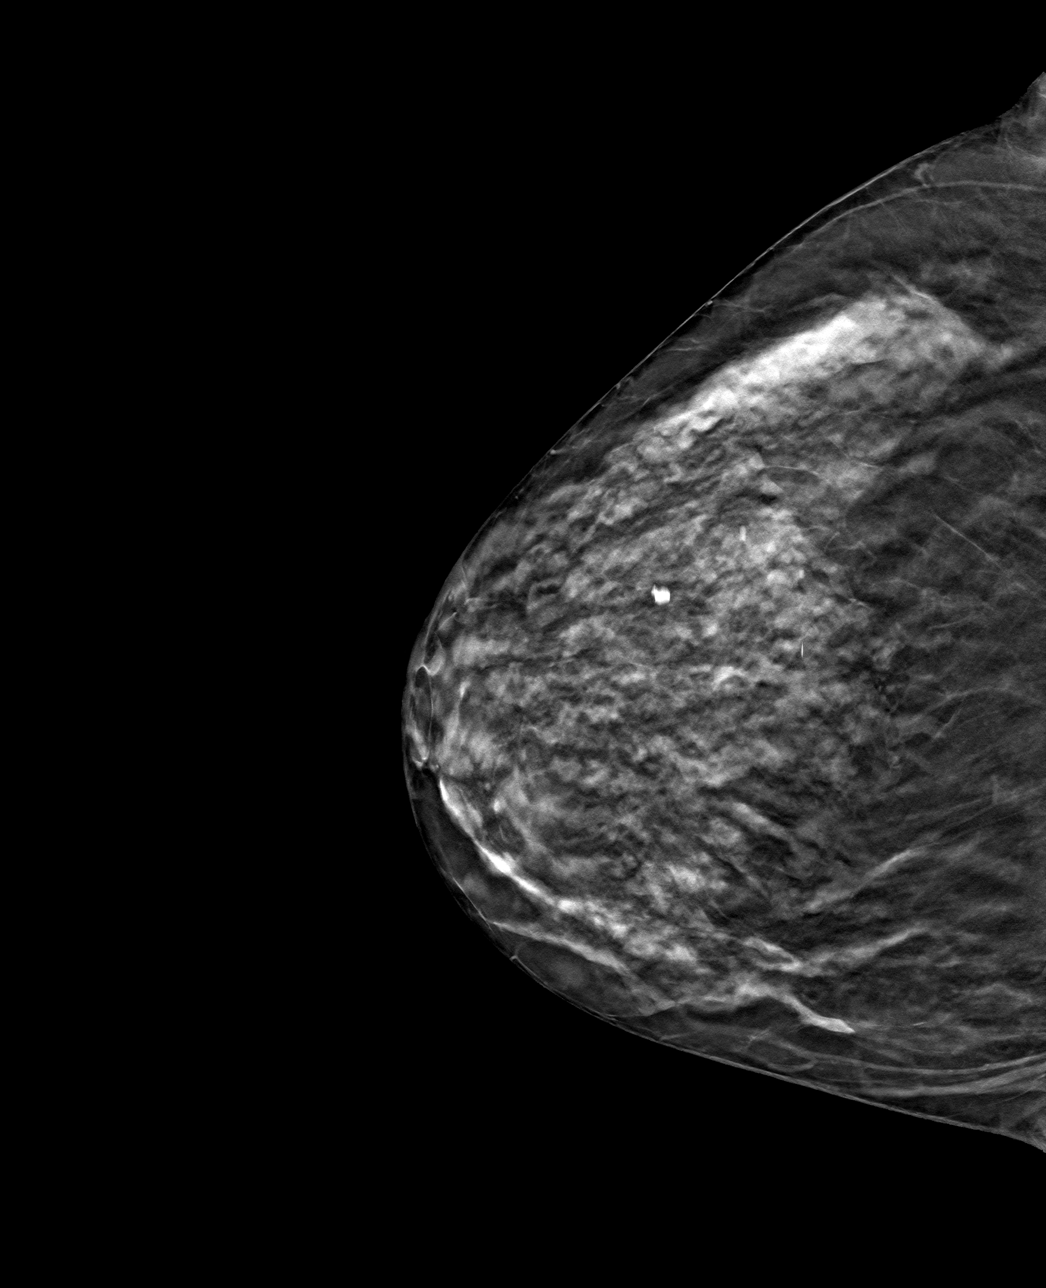

[R ML tomo · tomo slice 27/52.0]
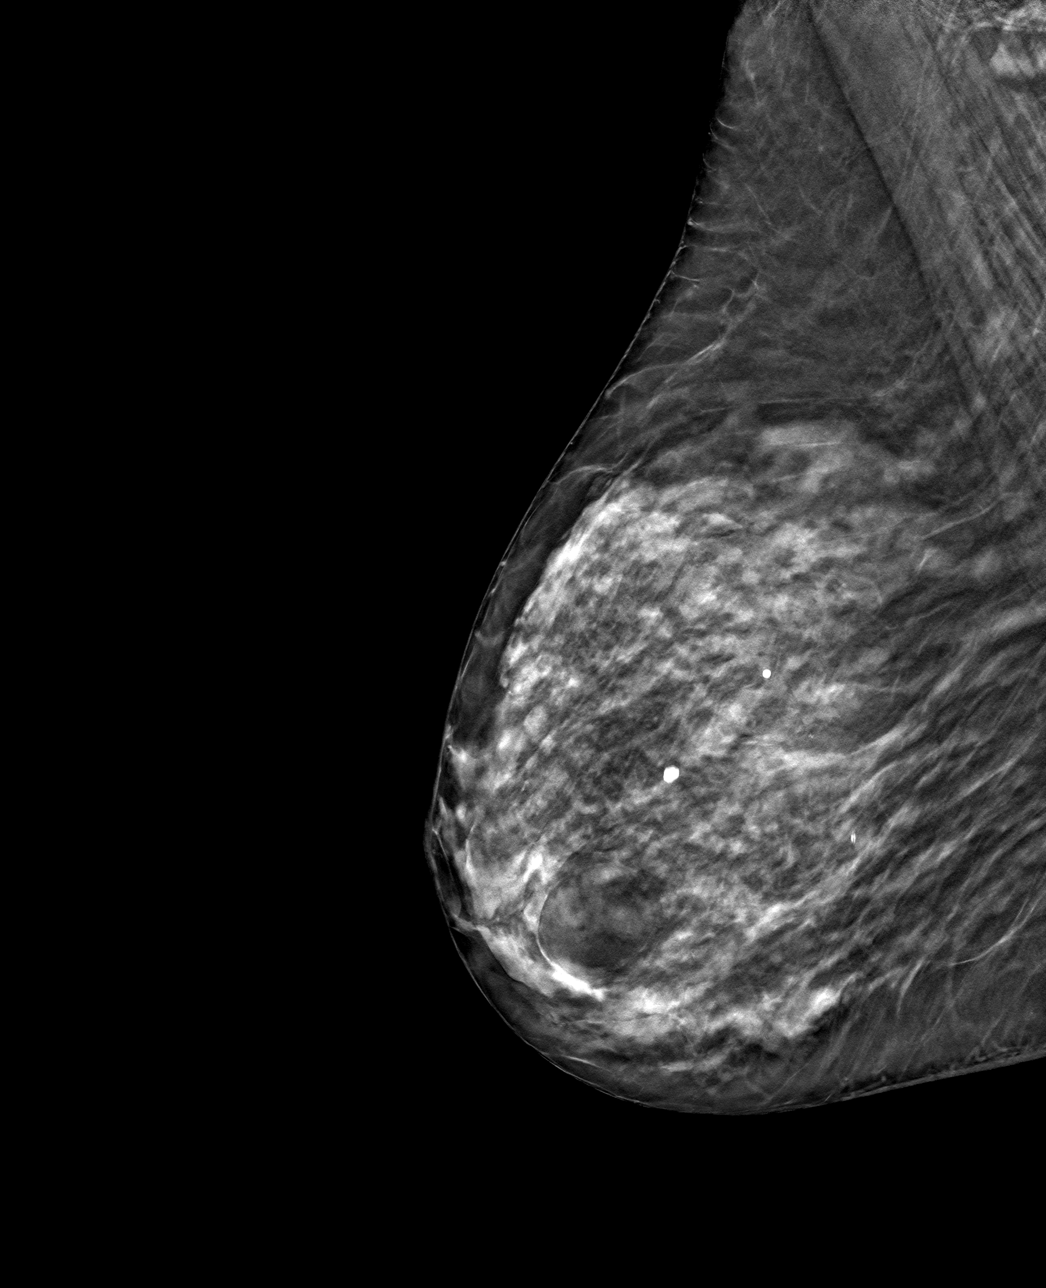

[4 of 12 positions shown; findings below may reference images not displayed]

FINDINGS: 3D Mammographic images were obtained following ultrasound guided
biopsy of the 0.9 cm mass in the LOWER OUTER RIGHT breast. The
RIBBON biopsy marking clip is in expected position at the site of
biopsy and corresponds to the mammographic asymmetry.
IMPRESSION: Appropriate positioning of the RIBBON shaped biopsy marking clip at
the site of biopsy in the LOWER OUTER RIGHT breast.

Final Assessment: Post Procedure Mammograms for Marker Placement

## 2021-07-13 DIAGNOSIS — D485 Neoplasm of uncertain behavior of skin: Secondary | ICD-10-CM | POA: Diagnosis not present

## 2021-08-18 ENCOUNTER — Telehealth: Payer: Self-pay | Admitting: Family Medicine

## 2021-08-18 NOTE — Telephone Encounter (Signed)
Left message for patient to call back and schedule Medicare Annual Wellness Visit (AWV) either virtually or in office. Left  my jabber number [phone]    awvi 07/03/12 per [facility]   please schedule at anytime with [facility] Nurse Health Advisor 1 or 2   This should be a 45 minute visit.  

## 2021-09-14 ENCOUNTER — Telehealth: Payer: Self-pay | Admitting: Family Medicine

## 2021-09-14 NOTE — Telephone Encounter (Signed)
Left message for patient to call back and schedule Medicare Annual Wellness Visit (AWV) either virtually or in office. Left  my Herbie Drape number [phone]   Last AWV ; 07/13/11 please schedule at anytime with [facility] Nurse Health Advisor 1 or 2

## 2021-09-16 ENCOUNTER — Encounter: Payer: Self-pay | Admitting: Family Medicine

## 2021-09-17 MED ORDER — ZOLPIDEM TARTRATE 10 MG PO TABS: 10.0000 mg | ORAL_TABLET | Freq: Every day | ORAL | 1 refills | Status: DC

## 2021-09-17 NOTE — Telephone Encounter (Signed)
Done

## 2021-10-13 ENCOUNTER — Encounter: Payer: Self-pay | Admitting: Family Medicine

## 2021-10-13 ENCOUNTER — Ambulatory Visit (INDEPENDENT_AMBULATORY_CARE_PROVIDER_SITE_OTHER): Payer: Medicare Other | Admitting: Family Medicine

## 2021-10-13 VITALS — BP 120/82 | HR 63 | Temp 98.1°F | Ht 62.5 in | Wt 140.4 lb

## 2021-10-13 DIAGNOSIS — G4700 Insomnia, unspecified: Secondary | ICD-10-CM | POA: Diagnosis not present

## 2021-10-13 DIAGNOSIS — E782 Mixed hyperlipidemia: Secondary | ICD-10-CM

## 2021-10-13 DIAGNOSIS — M19041 Primary osteoarthritis, right hand: Secondary | ICD-10-CM | POA: Diagnosis not present

## 2021-10-13 DIAGNOSIS — I10 Essential (primary) hypertension: Secondary | ICD-10-CM

## 2021-10-13 DIAGNOSIS — D508 Other iron deficiency anemias: Secondary | ICD-10-CM | POA: Diagnosis not present

## 2021-10-13 DIAGNOSIS — R739 Hyperglycemia, unspecified: Secondary | ICD-10-CM | POA: Diagnosis not present

## 2021-10-13 DIAGNOSIS — E039 Hypothyroidism, unspecified: Secondary | ICD-10-CM

## 2021-10-13 LAB — HEPATIC FUNCTION PANEL
ALT: 18 U/L (ref 0–35)
AST: 23 U/L (ref 0–37)
Albumin: 4.1 g/dL (ref 3.5–5.2)
Alkaline Phosphatase: 59 U/L (ref 39–117)
Bilirubin, Direct: 0.1 mg/dL (ref 0.0–0.3)
Total Bilirubin: 0.5 mg/dL (ref 0.2–1.2)
Total Protein: 6.9 g/dL (ref 6.0–8.3)

## 2021-10-13 LAB — CBC WITH DIFFERENTIAL/PLATELET
Basophils Absolute: 0.1 10*3/uL (ref 0.0–0.1)
Basophils Relative: 1.1 % (ref 0.0–3.0)
Eosinophils Absolute: 0.3 10*3/uL (ref 0.0–0.7)
Eosinophils Relative: 7.1 % — ABNORMAL HIGH (ref 0.0–5.0)
HCT: 40 % (ref 36.0–46.0)
Hemoglobin: 13.3 g/dL (ref 12.0–15.0)
Lymphocytes Relative: 26.6 % (ref 12.0–46.0)
Lymphs Abs: 1.2 10*3/uL (ref 0.7–4.0)
MCHC: 33.2 g/dL (ref 30.0–36.0)
MCV: 88.6 fl (ref 78.0–100.0)
Monocytes Absolute: 0.4 10*3/uL (ref 0.1–1.0)
Monocytes Relative: 10.1 % (ref 3.0–12.0)
Neutro Abs: 2.5 10*3/uL (ref 1.4–7.7)
Neutrophils Relative %: 55.1 % (ref 43.0–77.0)
Platelets: 196 10*3/uL (ref 150.0–400.0)
RBC: 4.51 Mil/uL (ref 3.87–5.11)
RDW: 15.5 % (ref 11.5–15.5)
WBC: 4.5 10*3/uL (ref 4.0–10.5)

## 2021-10-13 LAB — LIPID PANEL
Cholesterol: 228 mg/dL — ABNORMAL HIGH (ref 0–200)
HDL: 78.3 mg/dL (ref >39.00)
LDL Cholesterol: 138 mg/dL — ABNORMAL HIGH (ref 0–99)
NonHDL: 149.76
Total CHOL/HDL Ratio: 3
Triglycerides: 57 mg/dL (ref 0.0–149.0)
VLDL: 11.4 mg/dL (ref 0.0–40.0)

## 2021-10-13 LAB — BASIC METABOLIC PANEL
BUN: 18 mg/dL (ref 6–23)
CO2: 26 mEq/L (ref 19–32)
Calcium: 9.5 mg/dL (ref 8.4–10.5)
Chloride: 105 mEq/L (ref 96–112)
Creatinine, Ser: 0.89 mg/dL (ref 0.40–1.20)
GFR: 61.27 mL/min (ref >60.00)
Glucose, Bld: 88 mg/dL (ref 70–99)
Potassium: 4.5 mEq/L (ref 3.5–5.1)
Sodium: 137 mEq/L (ref 135–145)

## 2021-10-13 LAB — TSH: TSH: 2.57 u[IU]/mL (ref 0.35–5.50)

## 2021-10-13 LAB — VITAMIN B12: Vitamin B-12: 227 pg/mL (ref 211–911)

## 2021-10-13 LAB — HEMOGLOBIN A1C: Hgb A1c MFr Bld: 5.7 % (ref 4.6–6.5)

## 2021-10-13 MED ORDER — RAMIPRIL 5 MG PO CAPS: 5.0000 mg | ORAL_CAPSULE | Freq: Every day | ORAL | 3 refills | Status: DC

## 2021-10-13 NOTE — Progress Notes (Signed)
Subjective:    Patient ID: Tinnie Gens, female    DOB: [birth year], 81 y.o.   MRN: [id number]  HPI Here to follow up on issues. Her only concern today is several months of intermittent swelling and pain in the right hand. No hx of trauma, but it bothers her the most when she uses it a lot, as in gardening or cooking. Sometimes she takes one of her husband's Diclofenac pills and this helps. Otherwise her BP is stable. She sleeps well by taking Zolpidem.    Review of Systems  Constitutional: Negative.   HENT: Negative.    Eyes: Negative.   Respiratory: Negative.    Cardiovascular: Negative.   Gastrointestinal: Negative.   Genitourinary:  Negative for decreased urine volume, difficulty urinating, dyspareunia, dysuria, enuresis, flank pain, frequency, hematuria, pelvic pain and urgency.  Musculoskeletal:  Positive for arthralgias.  Skin: Negative.   Neurological: Negative.  Negative for headaches.  Psychiatric/Behavioral: Negative.         Objective:   Physical Exam Constitutional:      General: She is not in acute distress.    Appearance: Normal appearance. She is well-developed.  HENT:     Head: Normocephalic and atraumatic.     Right Ear: External ear normal.     Left Ear: External ear normal.     Nose: Nose normal.     Mouth/Throat:     Pharynx: No oropharyngeal exudate.  Eyes:     General: No scleral icterus.    Conjunctiva/sclera: Conjunctivae normal.     Pupils: Pupils are equal, round, and reactive to light.  Neck:     Thyroid: No thyromegaly.     Vascular: No JVD.  Cardiovascular:     Rate and Rhythm: Normal rate and regular rhythm.     Heart sounds: Normal heart sounds. No murmur heard.    No friction rub. No gallop.  Pulmonary:     Effort: Pulmonary effort is normal. No respiratory distress.     Breath sounds: Normal breath sounds. No wheezing or rales.  Chest:     Chest wall: No tenderness.  Abdominal:     General: Bowel sounds are normal. There is  no distension.     Palpations: Abdomen is soft. There is no mass.     Tenderness: There is no abdominal tenderness. There is no guarding or rebound.  Musculoskeletal:        General: Normal range of motion.     Cervical back: Normal range of motion and neck supple.     Comments: The right hand has some swelling and tenderness and crepitus at the base of the thumb  Lymphadenopathy:     Cervical: No cervical adenopathy.  Skin:    General: Skin is warm and dry.     Findings: No erythema or rash.  Neurological:     Mental Status: She is alert and oriented to person, place, and time.     Cranial Nerves: No cranial nerve deficit.     Motor: No abnormal muscle tone.     Coordination: Coordination normal.     Deep Tendon Reflexes: Reflexes are normal and symmetric. Reflexes normal.  Psychiatric:        Behavior: Behavior normal.        Thought Content: Thought content normal.        Judgment: Judgment normal.           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 Her HTN is stable. We will get fasting labs to check her lipids, to look for anemia, etc.  Alysia Penna, MD

## 2021-11-05 ENCOUNTER — Telehealth: Payer: Self-pay | Admitting: Family Medicine

## 2021-11-05 NOTE — Telephone Encounter (Signed)
Left message for patient to call back and schedule Medicare Annual Wellness Visit (AWV) either virtually or in office. Left  my Katherine Holland number [phone]    awvi 07/03/12 per [facility]   please schedule at anytime with [facility] Nurse Health Advisor 1 or 2   This should be a 45 minute visit.

## 2021-11-19 ENCOUNTER — Ambulatory Visit (INDEPENDENT_AMBULATORY_CARE_PROVIDER_SITE_OTHER): Payer: Medicare Other | Admitting: Family Medicine

## 2021-11-19 ENCOUNTER — Encounter: Payer: Self-pay | Admitting: Family Medicine

## 2021-11-19 VITALS — BP 126/78 | HR 85 | Temp 98.4°F | Wt 141.0 lb

## 2021-11-19 DIAGNOSIS — H6982 Other specified disorders of Eustachian tube, left ear: Secondary | ICD-10-CM

## 2021-11-19 DIAGNOSIS — R42 Dizziness and giddiness: Secondary | ICD-10-CM

## 2021-11-19 MED ORDER — MECLIZINE HCL 25 MG PO TABS: 25.0000 mg | ORAL_TABLET | ORAL | 2 refills | Status: DC | PRN

## 2021-11-19 NOTE — Progress Notes (Signed)
   Subjective:    Patient ID: Katherine Holland, female    DOB: Oct 16, 1940, 81 y.o.   MRN: [id number]  HPI Here for several issues. First she has had bouts of dizziness in the past 3 weeks which she describes as the room spinning around her. No headache. No other neurologic deficits. Both times this flared up were immediately after her hair stylist laid her head back over a sink to wash her hair. This would then last a few days and go away. Today she feels fine. The other issue is feeling pressure in her left ear from time to time. There is no pain. No problems with hearing. She says this is worse when her allergies flare up. She takes a Loratadine every day.    Review of Systems  Constitutional: Negative.   HENT:  Positive for congestion and sinus pressure. Negative for ear discharge, ear pain, facial swelling, hearing loss, postnasal drip and sore throat.   Eyes: Negative.   Respiratory: Negative.    Cardiovascular: Negative.   Neurological:  Positive for dizziness. Negative for light-headedness and headaches.       Objective:   Physical Exam Constitutional:      Appearance: Normal appearance. She is not ill-appearing.  HENT:     Right Ear: Tympanic membrane, ear canal and external ear normal.     Left Ear: Tympanic membrane, ear canal and external ear normal.     Nose: Nose normal.     Mouth/Throat:     Pharynx: Oropharynx is clear.  Eyes:     Conjunctiva/sclera: Conjunctivae normal.  Neck:     Vascular: No carotid bruit.  Cardiovascular:     Rate and Rhythm: Normal rate and regular rhythm.     Pulses: Normal pulses.     Heart sounds: Normal heart sounds.  Pulmonary:     Effort: Pulmonary effort is normal.     Breath sounds: Normal breath sounds.  Lymphadenopathy:     Cervical: No cervical adenopathy.  Neurological:     General: No focal deficit present.     Mental Status: She is alert. Mental status is at baseline.           Assessment & Plan:  She is having  vertigo which is likely to be vestibular in origin. She can drink fluids and rest. Add Meclizine as needed. We will order a carotid US to check her vertebral artery circulation. The other issue is eustachian tube dysfunction. She will begin using Flonase nasal sprays daily. She can add Sudafed to her Loratadine as needed. Alysia Penna, MD

## 2021-11-25 ENCOUNTER — Telehealth: Payer: Self-pay | Admitting: Family Medicine

## 2021-11-25 NOTE — Telephone Encounter (Signed)
Left message for patient to call back and schedule Medicare Annual Wellness Visit (AWV) either virtually or in office. Left  my Herbie Drape number [phone]  awvi 07/03/12 per [facility]  please schedule at anytime with [facility] Nurse Health Advisor 1 or 2   This should be a 45 minute visit.

## 2021-11-26 ENCOUNTER — Ambulatory Visit (HOSPITAL_COMMUNITY)
Admission: RE | Admit: 2021-11-26 | Discharge: 2021-11-26 | Disposition: A | Discharge status: Home / Self Care | Payer: Medicare Other | Source: Ambulatory Visit | Attending: Cardiology | Admitting: Cardiology

## 2021-11-26 DIAGNOSIS — R42 Dizziness and giddiness: Secondary | ICD-10-CM | POA: Insufficient documentation

## 2021-12-01 ENCOUNTER — Ambulatory Visit
Admission: RE | Admit: 2021-12-01 | Discharge: 2021-12-01 | Disposition: A | Discharge status: Home / Self Care | Payer: Medicare Other | Source: Ambulatory Visit | Attending: Family Medicine | Admitting: Family Medicine

## 2021-12-01 DIAGNOSIS — Z1231 Encounter for screening mammogram for malignant neoplasm of breast: Secondary | ICD-10-CM

## 2021-12-09 ENCOUNTER — Encounter: Payer: Self-pay | Admitting: Family Medicine

## 2021-12-09 DIAGNOSIS — R42 Dizziness and giddiness: Secondary | ICD-10-CM

## 2021-12-10 DIAGNOSIS — R42 Dizziness and giddiness: Secondary | ICD-10-CM | POA: Insufficient documentation

## 2021-12-10 NOTE — Telephone Encounter (Signed)
I did the referral to ENT

## 2021-12-16 DIAGNOSIS — L812 Freckles: Secondary | ICD-10-CM | POA: Diagnosis not present

## 2021-12-16 DIAGNOSIS — L821 Other seborrheic keratosis: Secondary | ICD-10-CM | POA: Diagnosis not present

## 2021-12-16 DIAGNOSIS — D1801 Hemangioma of skin and subcutaneous tissue: Secondary | ICD-10-CM | POA: Diagnosis not present

## 2021-12-16 DIAGNOSIS — D2239 Melanocytic nevi of other parts of face: Secondary | ICD-10-CM | POA: Diagnosis not present

## 2021-12-16 DIAGNOSIS — L718 Other rosacea: Secondary | ICD-10-CM | POA: Diagnosis not present

## 2021-12-16 DIAGNOSIS — L820 Inflamed seborrheic keratosis: Secondary | ICD-10-CM | POA: Diagnosis not present

## 2021-12-16 DIAGNOSIS — D225 Melanocytic nevi of trunk: Secondary | ICD-10-CM | POA: Diagnosis not present

## 2021-12-20 ENCOUNTER — Telehealth: Payer: Self-pay | Admitting: Family Medicine

## 2021-12-20 NOTE — Telephone Encounter (Signed)
Spoke with patient to schedule Medicare Annual Wellness Visit (AWV) either virtually or in office.   Patient stated she will call back  she was driving    awvi 04/10/18 per [facility]  ; please schedule at anytime with [facility] Nurse Health Advisor 1 or 2   This should be a 45 minute visit.

## 2021-12-21 DIAGNOSIS — H43813 Vitreous degeneration, bilateral: Secondary | ICD-10-CM | POA: Diagnosis not present

## 2021-12-21 DIAGNOSIS — H35373 Puckering of macula, bilateral: Secondary | ICD-10-CM | POA: Diagnosis not present

## 2021-12-21 DIAGNOSIS — H2513 Age-related nuclear cataract, bilateral: Secondary | ICD-10-CM | POA: Diagnosis not present

## 2021-12-21 DIAGNOSIS — H25013 Cortical age-related cataract, bilateral: Secondary | ICD-10-CM | POA: Diagnosis not present

## 2021-12-21 DIAGNOSIS — H52203 Unspecified astigmatism, bilateral: Secondary | ICD-10-CM | POA: Diagnosis not present

## 2021-12-21 DIAGNOSIS — H524 Presbyopia: Secondary | ICD-10-CM | POA: Diagnosis not present

## 2021-12-30 ENCOUNTER — Ambulatory Visit (INDEPENDENT_AMBULATORY_CARE_PROVIDER_SITE_OTHER): Payer: Medicare Other

## 2021-12-30 DIAGNOSIS — Z23 Encounter for immunization: Secondary | ICD-10-CM

## 2022-01-13 DIAGNOSIS — H25811 Combined forms of age-related cataract, right eye: Secondary | ICD-10-CM | POA: Diagnosis not present

## 2022-01-13 DIAGNOSIS — H2511 Age-related nuclear cataract, right eye: Secondary | ICD-10-CM | POA: Diagnosis not present

## 2022-01-13 DIAGNOSIS — H25011 Cortical age-related cataract, right eye: Secondary | ICD-10-CM | POA: Diagnosis not present

## 2022-01-13 DIAGNOSIS — H269 Unspecified cataract: Secondary | ICD-10-CM | POA: Diagnosis not present

## 2022-01-13 DIAGNOSIS — H52221 Regular astigmatism, right eye: Secondary | ICD-10-CM | POA: Diagnosis not present

## 2022-01-27 ENCOUNTER — Telehealth: Payer: Self-pay | Admitting: Family Medicine

## 2022-01-27 DIAGNOSIS — H2512 Age-related nuclear cataract, left eye: Secondary | ICD-10-CM | POA: Diagnosis not present

## 2022-01-27 DIAGNOSIS — H25012 Cortical age-related cataract, left eye: Secondary | ICD-10-CM | POA: Diagnosis not present

## 2022-01-27 DIAGNOSIS — H25812 Combined forms of age-related cataract, left eye: Secondary | ICD-10-CM | POA: Diagnosis not present

## 2022-01-27 DIAGNOSIS — H269 Unspecified cataract: Secondary | ICD-10-CM | POA: Diagnosis not present

## 2022-01-27 NOTE — Telephone Encounter (Signed)
Left message for patient to call back and schedule Medicare Annual Wellness Visit (AWV) either virtually or in office. Left  my Katherine Holland number [phone]   awvi 07/03/12 per [facility]  please schedule with Nurse Health Adviser   45 min for awv-i and in office appointments 30 min for awv-s  phone/virtual appointments

## 2022-03-04 DIAGNOSIS — H35373 Puckering of macula, bilateral: Secondary | ICD-10-CM | POA: Diagnosis not present

## 2022-03-09 ENCOUNTER — Telehealth: Payer: Self-pay | Admitting: Family Medicine

## 2022-03-09 NOTE — Telephone Encounter (Signed)
Left message for patient to call back and schedule Medicare Annual Wellness Visit (AWV) either virtually or in office. Left  my Katherine Holland number [phone]    awvi 07/03/12 per [facility]  please schedule with Nurse Health Adviser   45 min for awv-i and in office appointments 30 min for awv-s  phone/virtual appointments

## 2022-03-10 DIAGNOSIS — H903 Sensorineural hearing loss, bilateral: Secondary | ICD-10-CM | POA: Diagnosis not present

## 2022-03-16 ENCOUNTER — Encounter: Payer: Self-pay | Admitting: Family Medicine

## 2022-03-16 ENCOUNTER — Telehealth: Payer: Medicare Other | Admitting: Family Medicine

## 2022-03-16 ENCOUNTER — Ambulatory Visit: Payer: Medicare Other | Admitting: Family Medicine

## 2022-03-16 DIAGNOSIS — U071 COVID-19: Secondary | ICD-10-CM | POA: Diagnosis not present

## 2022-03-16 DIAGNOSIS — R059 Cough, unspecified: Secondary | ICD-10-CM | POA: Diagnosis not present

## 2022-03-16 DIAGNOSIS — J029 Acute pharyngitis, unspecified: Secondary | ICD-10-CM | POA: Diagnosis not present

## 2022-03-16 LAB — POCT INFLUENZA A/B
Influenza A, POC: NEGATIVE
Influenza B, POC: NEGATIVE

## 2022-03-16 LAB — POCT RAPID STREP A (OFFICE): Rapid Strep A Screen: NEGATIVE

## 2022-03-16 LAB — POC COVID19 BINAXNOW: SARS Coronavirus 2 Ag: POSITIVE — AB

## 2022-03-16 MED ORDER — NIRMATRELVIR/RITONAVIR (PAXLOVID)TABLET: 3.0000 | ORAL_TABLET | Freq: Two times a day (BID) | ORAL | 0 refills | Status: AC

## 2022-03-16 NOTE — Addendum Note (Signed)
Addended by: Wyvonne Lenz on: 03/16/2022 11:49 AM   Modules accepted: Orders

## 2022-03-16 NOTE — Progress Notes (Signed)
Subjective:    Patient ID: Katherine Holland, female    DOB: Sep 06, 1940, 81 y.o.   MRN: [id number]  HPI Virtual Visit via Video Note  I connected with the patient on 03/16/22 at 10:45 AM EST by a video enabled telemedicine application and verified that I am speaking with the correct person using two identifiers.  Location patient: home Location provider:work or home office Persons participating in the virtual visit: patient, provider  I discussed the limitations of evaluation and management by telemedicine and the availability of in person appointments. The patient expressed understanding and agreed to proceed.   HPI: Here for several days of ST and a dry cough. No SOB or fever. She has tested positive for Covid.    ROS: See pertinent positives and negatives per HPI.  Past Medical History:  Diagnosis Date   Abdominal bloating    Anemia    Arthritis    Constipation    Gynecological examination    sees Dr. Sharrie Rothman   Hyperlipidemia    Hypertension    Hypothyroidism    Insomnia    Osteopenia    last DEXA on 06-06-07 was normal    Plantar fasciitis, bilateral    Post-menopausal    Urinary frequency     Past Surgical History:  Procedure Laterality Date   APPENDECTOMY     BREAST BIOPSY Right 2009   BREAST LUMPECTOMY WITH RADIOACTIVE SEED LOCALIZATION Right 01/29/2020   Procedure: BREAST LUMPECTOMY WITH RADIOACTIVE SEED LOCALIZATION;  Surgeon: Jovita Kussmaul, MD;  Location: [facility];  Service: General;  Laterality: Right;   COLONOSCOPY  08/17/2006   per Dr. Velora Heckler, diverticulosis, no repeats, elected to do Cologuard instead (2018-negative)    HERNIA REPAIR      Family History  Problem Relation Age of Onset   Breast cancer Neg Hx      Current Outpatient Medications:    aspirin 81 MG tablet, Take 81 mg by mouth daily., Disp: , Rfl:    Biotin 1 MG CAPS, Take by mouth., Disp: , Rfl:    glucosamine-chondroitin 500-400 MG tablet, Take 1 tablet by mouth 3  (three) times daily., Disp: , Rfl:    meclizine (ANTIVERT) 25 MG tablet, Take 1 tablet (25 mg total) by mouth every 4 (four) hours as needed for dizziness., Disp: 60 tablet, Rfl: 2   ramipril (ALTACE) 5 MG capsule, Take 1 capsule (5 mg total) by mouth daily., Disp: 90 capsule, Rfl: 3   vitamin E 600 UNIT capsule, Take 600 Units by mouth daily., Disp: , Rfl:    zolpidem (AMBIEN) 10 MG tablet, Take 1 tablet (10 mg total) by mouth at bedtime., Disp: 90 tablet, Rfl: 1  EXAM:  VITALS per patient if applicable:  GENERAL: alert, oriented, appears well and in no acute distress  HEENT: atraumatic, conjunttiva clear, no obvious abnormalities on inspection of external nose and ears  NECK: normal movements of the head and neck  LUNGS: on inspection no signs of respiratory distress, breathing rate appears normal, no obvious gross SOB, gasping or wheezing  CV: no obvious cyanosis  MS: moves all visible extremities without noticeable abnormality  PSYCH/NEURO: pleasant and cooperative, no obvious depression or anxiety, speech and thought processing grossly intact  ASSESSMENT AND PLAN: Covid infection, treat with 5 days of Paxlovid. Add Mucinex as needed.  Alysia Penna, MD  Discussed the following assessment and plan:  No diagnosis found.     I discussed the assessment and treatment plan with the patient.  The patient was provided an opportunity to ask questions and all were answered. The patient agreed with the plan and demonstrated an understanding of the instructions.   The patient was advised to call back or seek an in-person evaluation if the symptoms worsen or if the condition fails to improve as anticipated.    Review of Systems     Objective:   Physical Exam        Assessment & Plan:

## 2022-03-23 DIAGNOSIS — J029 Acute pharyngitis, unspecified: Secondary | ICD-10-CM | POA: Diagnosis not present

## 2022-04-07 DIAGNOSIS — H818X1 Other disorders of vestibular function, right ear: Secondary | ICD-10-CM | POA: Diagnosis not present

## 2022-04-07 DIAGNOSIS — M2569 Stiffness of other specified joint, not elsewhere classified: Secondary | ICD-10-CM | POA: Diagnosis not present

## 2022-04-14 DIAGNOSIS — H818X1 Other disorders of vestibular function, right ear: Secondary | ICD-10-CM | POA: Diagnosis not present

## 2022-04-14 DIAGNOSIS — M2569 Stiffness of other specified joint, not elsewhere classified: Secondary | ICD-10-CM | POA: Diagnosis not present

## 2022-04-29 ENCOUNTER — Telehealth: Payer: Self-pay | Admitting: Family Medicine

## 2022-04-29 NOTE — Telephone Encounter (Signed)
Left message for patient to call back and schedule Medicare Annual Wellness Visit (AWV) either virtually or in office. Left  my Katherine Holland number [phone]   awvi 07/03/12 per [facility] please schedule with Nurse Health Adviser   45 min for awv-i  in office appointments 30 min for awv-s & awv-i phone/virtual appointments

## 2022-05-19 DIAGNOSIS — C44629 Squamous cell carcinoma of skin of left upper limb, including shoulder: Secondary | ICD-10-CM | POA: Diagnosis not present

## 2022-06-05 ENCOUNTER — Encounter: Payer: Self-pay | Admitting: Family Medicine

## 2022-06-06 MED ORDER — ZOLPIDEM TARTRATE 10 MG PO TABS: 10.0000 mg | ORAL_TABLET | Freq: Every day | ORAL | 1 refills | Status: DC

## 2022-06-06 NOTE — Telephone Encounter (Signed)
Done

## 2022-06-19 DIAGNOSIS — B029 Zoster without complications: Secondary | ICD-10-CM | POA: Diagnosis not present

## 2022-06-19 DIAGNOSIS — I10 Essential (primary) hypertension: Secondary | ICD-10-CM | POA: Diagnosis not present

## 2022-06-20 ENCOUNTER — Telehealth: Payer: Self-pay | Admitting: Family Medicine

## 2022-06-20 NOTE — Telephone Encounter (Signed)
Called patient to schedule Medicare Annual Wellness Visit (AWV). Left message for patient to call back and schedule Medicare Annual Wellness Visit (AWV).  Last date of AWV: due   awvi 12/03/21 per [facility]  Please schedule an appointment at any time with [facility] or [facility]  If any questions, please contact me at [phone].  Thank you ,  Robin [facility] AWV direct phone # [phone] 

## 2022-07-18 ENCOUNTER — Other Ambulatory Visit: Payer: Self-pay | Admitting: Family Medicine

## 2022-07-18 DIAGNOSIS — Z1231 Encounter for screening mammogram for malignant neoplasm of breast: Secondary | ICD-10-CM

## 2022-08-11 ENCOUNTER — Telehealth: Payer: Self-pay | Admitting: Family Medicine

## 2022-08-11 NOTE — Telephone Encounter (Signed)
Called patient to schedule Medicare Annual Wellness Visit (AWV). Unable to reach patient.  Last date of AWV: AWVI 07/03/12 per pa[facility]  Please schedule an appointment at any time with [facility] or [facility] [employer] [facility].  If any questions, please contact me at [phone].  Thank you ,  Rudell Cobb AWV direct phone # [phone]   Several attempts have been made to contact patient lm 08/11/22  06/20/22 04/29/22 03/09/22 01/25/22 12/20/21 11/25/21 11/05/21 09/14/21 08/18/21 02/01/21 11/24/20 10/21/20 09/01/20 04/22/20*wcb*wcb 06/12/20, 04/22/20  cpe 10/12/20   *AWVI 07/03/12 per pa*[facility]

## 2022-09-13 DIAGNOSIS — L821 Other seborrheic keratosis: Secondary | ICD-10-CM | POA: Diagnosis not present

## 2022-09-13 DIAGNOSIS — Z85828 Personal history of other malignant neoplasm of skin: Secondary | ICD-10-CM | POA: Diagnosis not present

## 2022-10-26 ENCOUNTER — Encounter: Payer: Medicare Other | Admitting: Family Medicine

## 2022-10-26 ENCOUNTER — Ambulatory Visit (INDEPENDENT_AMBULATORY_CARE_PROVIDER_SITE_OTHER): Payer: Medicare Other | Admitting: Family Medicine

## 2022-10-26 ENCOUNTER — Encounter: Payer: Self-pay | Admitting: Family Medicine

## 2022-10-26 VITALS — BP 136/64 | HR 68 | Temp 98.1°F | Ht 62.5 in | Wt 147.8 lb

## 2022-10-26 DIAGNOSIS — E039 Hypothyroidism, unspecified: Secondary | ICD-10-CM | POA: Diagnosis not present

## 2022-10-26 DIAGNOSIS — Z79890 Hormone replacement therapy (postmenopausal): Secondary | ICD-10-CM

## 2022-10-26 DIAGNOSIS — E782 Mixed hyperlipidemia: Secondary | ICD-10-CM

## 2022-10-26 DIAGNOSIS — D508 Other iron deficiency anemias: Secondary | ICD-10-CM

## 2022-10-26 DIAGNOSIS — G4700 Insomnia, unspecified: Secondary | ICD-10-CM | POA: Diagnosis not present

## 2022-10-26 DIAGNOSIS — R42 Dizziness and giddiness: Secondary | ICD-10-CM | POA: Diagnosis not present

## 2022-10-26 DIAGNOSIS — Z1211 Encounter for screening for malignant neoplasm of colon: Secondary | ICD-10-CM

## 2022-10-26 DIAGNOSIS — I10 Essential (primary) hypertension: Secondary | ICD-10-CM | POA: Diagnosis not present

## 2022-10-26 DIAGNOSIS — R739 Hyperglycemia, unspecified: Secondary | ICD-10-CM

## 2022-10-26 LAB — CBC WITH DIFFERENTIAL/PLATELET
Basophils Absolute: 0.1 10*3/uL (ref 0.0–0.1)
Basophils Relative: 1.2 % (ref 0.0–3.0)
Eosinophils Absolute: 0.3 10*3/uL (ref 0.0–0.7)
Eosinophils Relative: 5.2 % — ABNORMAL HIGH (ref 0.0–5.0)
HCT: 42.9 % (ref 36.0–46.0)
Hemoglobin: 13.9 g/dL (ref 12.0–15.0)
Lymphocytes Relative: 28.8 % (ref 12.0–46.0)
Lymphs Abs: 1.4 10*3/uL (ref 0.7–4.0)
MCHC: 32.4 g/dL (ref 30.0–36.0)
MCV: 89.8 fl (ref 78.0–100.0)
Monocytes Absolute: 0.5 10*3/uL (ref 0.1–1.0)
Monocytes Relative: 11 % (ref 3.0–12.0)
Neutro Abs: 2.6 10*3/uL (ref 1.4–7.7)
Neutrophils Relative %: 53.8 % (ref 43.0–77.0)
Platelets: 220 10*3/uL (ref 150.0–400.0)
RBC: 4.78 Mil/uL (ref 3.87–5.11)
RDW: 14.9 % (ref 11.5–15.5)
WBC: 4.8 10*3/uL (ref 4.0–10.5)

## 2022-10-26 LAB — HEPATIC FUNCTION PANEL
ALT: 13 U/L (ref 0–35)
AST: 18 U/L (ref 0–37)
Albumin: 4 g/dL (ref 3.5–5.2)
Alkaline Phosphatase: 55 U/L (ref 39–117)
Bilirubin, Direct: 0.1 mg/dL (ref 0.0–0.3)
Total Bilirubin: 0.5 mg/dL (ref 0.2–1.2)
Total Protein: 6.6 g/dL (ref 6.0–8.3)

## 2022-10-26 LAB — LIPID PANEL
Cholesterol: 215 mg/dL — ABNORMAL HIGH (ref 0–200)
HDL: 70.5 mg/dL (ref >39.00)
LDL Cholesterol: 129 mg/dL — ABNORMAL HIGH (ref 0–99)
NonHDL: 144
Total CHOL/HDL Ratio: 3
Triglycerides: 76 mg/dL (ref 0.0–149.0)
VLDL: 15.2 mg/dL (ref 0.0–40.0)

## 2022-10-26 LAB — BASIC METABOLIC PANEL
BUN: 18 mg/dL (ref 6–23)
CO2: 25 mEq/L (ref 19–32)
Calcium: 9.4 mg/dL (ref 8.4–10.5)
Chloride: 106 mEq/L (ref 96–112)
Creatinine, Ser: 1.02 mg/dL (ref 0.40–1.20)
GFR: 51.65 mL/min — ABNORMAL LOW (ref >60.00)
Glucose, Bld: 97 mg/dL (ref 70–99)
Potassium: 4.7 mEq/L (ref 3.5–5.1)
Sodium: 139 mEq/L (ref 135–145)

## 2022-10-26 LAB — TSH: TSH: 3.84 u[IU]/mL (ref 0.35–5.50)

## 2022-10-26 LAB — HEMOGLOBIN A1C: Hgb A1c MFr Bld: 5.7 % (ref 4.6–6.5)

## 2022-10-26 MED ORDER — RAMIPRIL 5 MG PO CAPS: 5.0000 mg | ORAL_CAPSULE | Freq: Every day | ORAL | 3 refills | Status: DC

## 2022-10-26 MED ORDER — ZOLPIDEM TARTRATE 10 MG PO TABS: 10.0000 mg | ORAL_TABLET | Freq: Every day | ORAL | 1 refills | Status: DC

## 2022-10-26 NOTE — Progress Notes (Signed)
   Subjective:    Patient ID: Katherine Holland, female    DOB: Jul 27, 1940, 82 y.o.   MRN: [id number]  HPI Here to follow up on issues. She feels well. She is sleeping soundly. Her BP is stable. She recently had a full GYN exam.    Review of Systems  Constitutional: Negative.   HENT: Negative.    Eyes: Negative.   Respiratory: Negative.    Cardiovascular: Negative.   Gastrointestinal: Negative.   Genitourinary:  Negative for decreased urine volume, difficulty urinating, dyspareunia, dysuria, enuresis, flank pain, frequency, hematuria, pelvic pain and urgency.  Musculoskeletal: Negative.   Skin: Negative.   Neurological: Negative.  Negative for headaches.  Psychiatric/Behavioral: Negative.         Objective:   Physical Exam Constitutional:      General: She is not in acute distress.    Appearance: Normal appearance. She is well-developed.  HENT:     Head: Normocephalic and atraumatic.     Right Ear: External ear normal.     Left Ear: External ear normal.     Nose: Nose normal.     Mouth/Throat:     Pharynx: No oropharyngeal exudate.  Eyes:     General: No scleral icterus.    Conjunctiva/sclera: Conjunctivae normal.     Pupils: Pupils are equal, round, and reactive to light.  Neck:     Thyroid: No thyromegaly.     Vascular: No JVD.  Cardiovascular:     Rate and Rhythm: Normal rate and regular rhythm.     Pulses: Normal pulses.     Heart sounds: Normal heart sounds. No murmur heard.    No friction rub. No gallop.  Pulmonary:     Effort: Pulmonary effort is normal. No respiratory distress.     Breath sounds: Normal breath sounds. No wheezing or rales.  Chest:     Chest wall: No tenderness.  Abdominal:     General: Bowel sounds are normal. There is no distension.     Palpations: Abdomen is soft. There is no mass.     Tenderness: There is no abdominal tenderness. There is no guarding or rebound.  Musculoskeletal:        General: No tenderness. Normal range of motion.      Cervical back: Normal range of motion and neck supple.  Lymphadenopathy:     Cervical: No cervical adenopathy.  Skin:    General: Skin is warm and dry.     Findings: No erythema or rash.  Neurological:     General: No focal deficit present.     Mental Status: She is alert and oriented to person, place, and time.     Cranial Nerves: No cranial nerve deficit.     Motor: No abnormal muscle tone.     Coordination: Coordination normal.     Deep Tendon Reflexes: Reflexes are normal and symmetric. Reflexes normal.  Psychiatric:        Mood and Affect: Mood normal.        Behavior: Behavior normal.        Thought Content: Thought content normal.        Judgment: Judgment normal.           Assessment & Plan:  Her HTN and insomnia are stable. We will get fasting labs for lipids, CBC, etc. Set up another Cologuard test.  Gershon Crane, MD

## 2022-11-19 DIAGNOSIS — Z1211 Encounter for screening for malignant neoplasm of colon: Secondary | ICD-10-CM | POA: Diagnosis not present

## 2022-12-06 ENCOUNTER — Ambulatory Visit: Payer: Medicare Other

## 2022-12-08 ENCOUNTER — Ambulatory Visit
Admission: RE | Admit: 2022-12-08 | Discharge: 2022-12-08 | Disposition: A | Discharge status: Home / Self Care | Payer: Medicare Other | Source: Ambulatory Visit | Attending: Family Medicine | Admitting: Family Medicine

## 2022-12-08 DIAGNOSIS — Z1231 Encounter for screening mammogram for malignant neoplasm of breast: Secondary | ICD-10-CM

## 2023-01-17 DIAGNOSIS — Z85828 Personal history of other malignant neoplasm of skin: Secondary | ICD-10-CM | POA: Diagnosis not present

## 2023-01-17 DIAGNOSIS — L821 Other seborrheic keratosis: Secondary | ICD-10-CM | POA: Diagnosis not present

## 2023-01-17 DIAGNOSIS — L718 Other rosacea: Secondary | ICD-10-CM | POA: Diagnosis not present

## 2023-01-17 DIAGNOSIS — D225 Melanocytic nevi of trunk: Secondary | ICD-10-CM | POA: Diagnosis not present

## 2023-01-17 DIAGNOSIS — D1801 Hemangioma of skin and subcutaneous tissue: Secondary | ICD-10-CM | POA: Diagnosis not present

## 2023-02-16 ENCOUNTER — Ambulatory Visit (INDEPENDENT_AMBULATORY_CARE_PROVIDER_SITE_OTHER): Payer: Medicare Other

## 2023-02-16 DIAGNOSIS — Z23 Encounter for immunization: Secondary | ICD-10-CM

## 2023-03-07 DIAGNOSIS — H26493 Other secondary cataract, bilateral: Secondary | ICD-10-CM | POA: Diagnosis not present

## 2023-03-07 DIAGNOSIS — H52202 Unspecified astigmatism, left eye: Secondary | ICD-10-CM | POA: Diagnosis not present

## 2023-03-07 DIAGNOSIS — Z961 Presence of intraocular lens: Secondary | ICD-10-CM | POA: Diagnosis not present

## 2023-03-07 DIAGNOSIS — H35373 Puckering of macula, bilateral: Secondary | ICD-10-CM | POA: Diagnosis not present

## 2023-03-07 DIAGNOSIS — H524 Presbyopia: Secondary | ICD-10-CM | POA: Diagnosis not present

## 2023-03-07 DIAGNOSIS — H5201 Hypermetropia, right eye: Secondary | ICD-10-CM | POA: Diagnosis not present

## 2023-03-07 DIAGNOSIS — H04123 Dry eye syndrome of bilateral lacrimal glands: Secondary | ICD-10-CM | POA: Diagnosis not present

## 2023-03-07 DIAGNOSIS — H43813 Vitreous degeneration, bilateral: Secondary | ICD-10-CM | POA: Diagnosis not present

## 2023-06-03 ENCOUNTER — Other Ambulatory Visit: Payer: Self-pay | Admitting: Family Medicine

## 2023-08-07 ENCOUNTER — Other Ambulatory Visit: Payer: Self-pay | Admitting: Family Medicine

## 2023-08-07 DIAGNOSIS — Z1231 Encounter for screening mammogram for malignant neoplasm of breast: Secondary | ICD-10-CM

## 2023-08-31 ENCOUNTER — Encounter: Payer: Self-pay | Admitting: Family Medicine

## 2023-10-31 ENCOUNTER — Ambulatory Visit (INDEPENDENT_AMBULATORY_CARE_PROVIDER_SITE_OTHER): Admitting: Family Medicine

## 2023-10-31 ENCOUNTER — Encounter: Payer: Self-pay | Admitting: Family Medicine

## 2023-10-31 VITALS — BP 132/80 | HR 61 | Temp 98.0°F | Ht 62.5 in | Wt 145.6 lb

## 2023-10-31 DIAGNOSIS — I498 Other specified cardiac arrhythmias: Secondary | ICD-10-CM | POA: Insufficient documentation

## 2023-10-31 DIAGNOSIS — R739 Hyperglycemia, unspecified: Secondary | ICD-10-CM | POA: Diagnosis not present

## 2023-10-31 DIAGNOSIS — G4700 Insomnia, unspecified: Secondary | ICD-10-CM

## 2023-10-31 DIAGNOSIS — R42 Dizziness and giddiness: Secondary | ICD-10-CM

## 2023-10-31 DIAGNOSIS — E782 Mixed hyperlipidemia: Secondary | ICD-10-CM | POA: Diagnosis not present

## 2023-10-31 DIAGNOSIS — E039 Hypothyroidism, unspecified: Secondary | ICD-10-CM | POA: Diagnosis not present

## 2023-10-31 DIAGNOSIS — D508 Other iron deficiency anemias: Secondary | ICD-10-CM | POA: Diagnosis not present

## 2023-10-31 DIAGNOSIS — I10 Essential (primary) hypertension: Secondary | ICD-10-CM | POA: Diagnosis not present

## 2023-10-31 LAB — IBC + FERRITIN
Ferritin: 52.7 ng/mL (ref 10.0–291.0)
Iron: 76 ug/dL (ref 42–145)
Saturation Ratios: 22 % (ref 20.0–50.0)
TIBC: 345.8 ug/dL (ref 250.0–450.0)
Transferrin: 247 mg/dL (ref 212.0–360.0)

## 2023-10-31 LAB — BASIC METABOLIC PANEL WITH GFR
BUN: 25 mg/dL — ABNORMAL HIGH (ref 6–23)
CO2: 26 meq/L (ref 19–32)
Calcium: 9.5 mg/dL (ref 8.4–10.5)
Chloride: 104 meq/L (ref 96–112)
Creatinine, Ser: 0.94 mg/dL (ref 0.40–1.20)
GFR: 56.57 mL/min — ABNORMAL LOW (ref >60.00)
Glucose, Bld: 87 mg/dL (ref 70–99)
Potassium: 4.9 meq/L (ref 3.5–5.1)
Sodium: 139 meq/L (ref 135–145)

## 2023-10-31 LAB — HEMOGLOBIN A1C: Hgb A1c MFr Bld: 6 % (ref 4.6–6.5)

## 2023-10-31 LAB — CBC WITH DIFFERENTIAL/PLATELET
Basophils Absolute: 0.1 K/uL (ref 0.0–0.1)
Basophils Relative: 1 % (ref 0.0–3.0)
Eosinophils Absolute: 0.5 K/uL (ref 0.0–0.7)
Eosinophils Relative: 9 % — ABNORMAL HIGH (ref 0.0–5.0)
HCT: 41.6 % (ref 36.0–46.0)
Hemoglobin: 13.5 g/dL (ref 12.0–15.0)
Lymphocytes Relative: 22.7 % (ref 12.0–46.0)
Lymphs Abs: 1.3 K/uL (ref 0.7–4.0)
MCHC: 32.5 g/dL (ref 30.0–36.0)
MCV: 86.5 fl (ref 78.0–100.0)
Monocytes Absolute: 0.7 K/uL (ref 0.1–1.0)
Monocytes Relative: 11.7 % (ref 3.0–12.0)
Neutro Abs: 3.2 K/uL (ref 1.4–7.7)
Neutrophils Relative %: 55.6 % (ref 43.0–77.0)
Platelets: 232 K/uL (ref 150.0–400.0)
RBC: 4.81 Mil/uL (ref 3.87–5.11)
RDW: 15.2 % (ref 11.5–15.5)
WBC: 5.7 K/uL (ref 4.0–10.5)

## 2023-10-31 LAB — LIPID PANEL
Cholesterol: 221 mg/dL — ABNORMAL HIGH (ref 0–200)
HDL: 67.6 mg/dL (ref >39.00)
LDL Cholesterol: 136 mg/dL — ABNORMAL HIGH (ref 0–99)
NonHDL: 153.88
Total CHOL/HDL Ratio: 3
Triglycerides: 89 mg/dL (ref 0.0–149.0)
VLDL: 17.8 mg/dL (ref 0.0–40.0)

## 2023-10-31 LAB — TSH: TSH: 2.79 u[IU]/mL (ref 0.35–5.50)

## 2023-10-31 LAB — HEPATIC FUNCTION PANEL
ALT: 15 U/L (ref 0–35)
AST: 17 U/L (ref 0–37)
Albumin: 4.1 g/dL (ref 3.5–5.2)
Alkaline Phosphatase: 56 U/L (ref 39–117)
Bilirubin, Direct: 0.1 mg/dL (ref 0.0–0.3)
Total Bilirubin: 0.5 mg/dL (ref 0.2–1.2)
Total Protein: 6.3 g/dL (ref 6.0–8.3)

## 2023-10-31 MED ORDER — RAMIPRIL 5 MG PO CAPS: 5.0000 mg | ORAL_CAPSULE | Freq: Every day | ORAL | 3 refills | Status: AC

## 2023-10-31 MED ORDER — ZOLPIDEM TARTRATE 10 MG PO TABS: 10.0000 mg | ORAL_TABLET | Freq: Every day | ORAL | 1 refills | Status: AC

## 2023-10-31 NOTE — Progress Notes (Signed)
 Subjective:    Patient ID: Katherine Holland, female    DOB: 11-Jul-1940, 83 y.o.   MRN: [id number]  HPI Here to follow up on issues. She feels great. She and her husband recently moved to [place], but they will return to [place] frequently. 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 She never feels any symptoms from this. We referred her to Dr. Shelda Bruckner for this in 2019. She wore a monitor that revealed occasional couplets and triplets. An ECHO was normal. A myocardial perfusion scan was normal. Dr. Bruckner felt the trigeminy is nothing to worry about as long as she is not symptomatic. Her BP is stable. She sleeps well using Zolpidem .    Review of Systems  Constitutional: Negative.   HENT: Negative.    Eyes: Negative.   Respiratory: Negative.    Cardiovascular: Negative.   Gastrointestinal: Negative.   Genitourinary:  Negative for decreased urine volume, difficulty urinating, dyspareunia, dysuria, enuresis, flank pain, frequency, hematuria, pelvic pain and urgency.  Musculoskeletal: Negative.   Skin: Negative.   Neurological: Negative.  Negative for headaches.  Psychiatric/Behavioral: Negative.         Objective:   Physical Exam Constitutional:      General: She is not in acute distress.    Appearance: Normal appearance. She is well-developed.  HENT:     Head: Normocephalic and atraumatic.     Right Ear: External ear normal.     Left Ear: External ear normal.     Nose: Nose normal.     Mouth/Throat:     Pharynx: No oropharyngeal exudate.  Eyes:     General: No scleral icterus.    Conjunctiva/sclera: Conjunctivae normal.     Pupils: Pupils are equal, round, and reactive to light.  Neck:     Thyroid : No thyromegaly.     Vascular: No JVD.  Cardiovascular:     Rate and Rhythm: Normal rate.     Pulses: Normal pulses.     Heart sounds: Normal heart sounds. No murmur heard.    No friction rub. No gallop.     Comments: She has a steady trigeminy pattern  today Pulmonary:     Effort: Pulmonary effort is normal. No respiratory distress.     Breath sounds: Normal breath sounds. No wheezing or rales.  Chest:     Chest wall: No tenderness.  Abdominal:     General: Bowel sounds are normal. There is no distension.     Palpations: Abdomen is soft. There is no mass.     Tenderness: There is no abdominal tenderness. There is no guarding or rebound.  Musculoskeletal:        General: No tenderness. Normal range of motion.     Cervical back: Normal range of motion and neck supple.  Lymphadenopathy:     Cervical: No cervical adenopathy.  Skin:    General: Skin is warm and dry.     Findings: No erythema or rash.  Neurological:     General: No focal deficit present.     Mental Status: She is alert and oriented to person, place, and time.     Cranial Nerves: No cranial nerve deficit.     Motor: No abnormal muscle tone.     Coordination: Coordination normal.     Deep Tendon Reflexes: Reflexes are normal and symmetric. Reflexes normal.  Psychiatric:        Mood and Affect: Mood normal.        Behavior: Behavior normal.  Thought Content: Thought content normal.        Judgment: Judgment normal.           Assessment & Plan:  She is doing well. Her trigeminy is not causes her any problems, so we will simply monitor this. Her HTN and insomnia are well controlled. We will get labs to check lipids, TSH, etc. We spent a total of ( 33  ) minutes reviewing records and discussing these issues.  Garnette Olmsted, MD

## 2023-11-02 ENCOUNTER — Ambulatory Visit: Payer: Self-pay | Admitting: Family Medicine

## 2023-12-14 ENCOUNTER — Ambulatory Visit
Admission: RE | Admit: 2023-12-14 | Discharge: 2023-12-14 | Disposition: A | Discharge status: Home / Self Care | Source: Ambulatory Visit | Attending: Family Medicine | Admitting: Family Medicine

## 2023-12-14 DIAGNOSIS — Z1231 Encounter for screening mammogram for malignant neoplasm of breast: Secondary | ICD-10-CM | POA: Diagnosis not present

## 2024-01-17 DIAGNOSIS — L821 Other seborrheic keratosis: Secondary | ICD-10-CM | POA: Diagnosis not present

## 2024-01-17 DIAGNOSIS — L718 Other rosacea: Secondary | ICD-10-CM | POA: Diagnosis not present

## 2024-01-17 DIAGNOSIS — D1801 Hemangioma of skin and subcutaneous tissue: Secondary | ICD-10-CM | POA: Diagnosis not present

## 2024-01-17 DIAGNOSIS — Z85828 Personal history of other malignant neoplasm of skin: Secondary | ICD-10-CM | POA: Diagnosis not present

## 2024-01-17 DIAGNOSIS — L812 Freckles: Secondary | ICD-10-CM | POA: Diagnosis not present

## 2024-01-17 DIAGNOSIS — D2271 Melanocytic nevi of right lower limb, including hip: Secondary | ICD-10-CM | POA: Diagnosis not present

## 2024-01-17 DIAGNOSIS — D225 Melanocytic nevi of trunk: Secondary | ICD-10-CM | POA: Diagnosis not present
